# Patient Record
Sex: Female | Born: 1970 | Race: Black or African American | Hispanic: No | Smoking: Never smoker
Health system: Southern US, Community
[De-identification: ages and names within clinical notes are randomized; demographics above are authoritative.]

## PROBLEM LIST (undated history)

## (undated) DIAGNOSIS — D649 Anemia, unspecified: Secondary | ICD-10-CM

## (undated) DIAGNOSIS — I10 Essential (primary) hypertension: Secondary | ICD-10-CM

## (undated) DIAGNOSIS — S83206A Unspecified tear of unspecified meniscus, current injury, right knee, initial encounter: Secondary | ICD-10-CM

## (undated) DIAGNOSIS — S6991XA Unspecified injury of right wrist, hand and finger(s), initial encounter: Secondary | ICD-10-CM

## (undated) DIAGNOSIS — K5900 Constipation, unspecified: Secondary | ICD-10-CM

## (undated) DIAGNOSIS — I471 Supraventricular tachycardia, unspecified: Secondary | ICD-10-CM

## (undated) DIAGNOSIS — Z800 Family history of malignant neoplasm of digestive organs: Secondary | ICD-10-CM

## (undated) DIAGNOSIS — R002 Palpitations: Secondary | ICD-10-CM

## (undated) HISTORY — DX: Unspecified tear of unspecified meniscus, current injury, right knee, initial encounter: S83.206A

## (undated) HISTORY — DX: Family history of malignant neoplasm of digestive organs: Z80.0

## (undated) HISTORY — DX: Supraventricular tachycardia: I47.1

## (undated) HISTORY — DX: Essential (primary) hypertension: I10

## (undated) HISTORY — DX: Constipation, unspecified: K59.00

## (undated) HISTORY — DX: Unspecified injury of right wrist, hand and finger(s), initial encounter: S69.91XA

## (undated) HISTORY — DX: Palpitations: R00.2

## (undated) HISTORY — DX: Supraventricular tachycardia, unspecified: I47.10

---

## 2005-01-25 HISTORY — PX: OTHER SURGICAL HISTORY: SHX169

## 2008-06-20 ENCOUNTER — Emergency Department (HOSPITAL_COMMUNITY): Admission: EM | Admit: 2008-06-20 | Discharge: 2008-06-20 | Payer: Self-pay | Admitting: Emergency Medicine

## 2008-07-24 ENCOUNTER — Encounter (INDEPENDENT_AMBULATORY_CARE_PROVIDER_SITE_OTHER): Payer: Self-pay | Admitting: Obstetrics and Gynecology

## 2008-07-24 ENCOUNTER — Ambulatory Visit (HOSPITAL_COMMUNITY): Admission: RE | Admit: 2008-07-24 | Discharge: 2008-07-24 | Payer: Self-pay | Admitting: Obstetrics and Gynecology

## 2008-07-25 ENCOUNTER — Encounter: Admission: RE | Admit: 2008-07-25 | Discharge: 2008-07-25 | Payer: Self-pay | Admitting: Obstetrics and Gynecology

## 2008-11-26 HISTORY — PX: US ECHOCARDIOGRAPHY: HXRAD669

## 2009-01-25 HISTORY — PX: KNEE ARTHROSCOPY: SUR90

## 2009-11-13 ENCOUNTER — Emergency Department (HOSPITAL_COMMUNITY): Admission: EM | Admit: 2009-11-13 | Discharge: 2009-11-13 | Payer: Self-pay | Admitting: Emergency Medicine

## 2009-11-15 ENCOUNTER — Emergency Department (HOSPITAL_COMMUNITY): Admission: EM | Admit: 2009-11-15 | Discharge: 2009-11-16 | Payer: Self-pay | Admitting: Emergency Medicine

## 2009-12-24 ENCOUNTER — Ambulatory Visit: Payer: Self-pay | Admitting: Cardiovascular Disease

## 2010-03-19 ENCOUNTER — Other Ambulatory Visit: Payer: Self-pay | Admitting: Family Medicine

## 2010-03-19 ENCOUNTER — Ambulatory Visit
Admission: RE | Admit: 2010-03-19 | Discharge: 2010-03-19 | Disposition: A | Discharge status: Home / Self Care | Payer: BC Managed Care – PPO | Source: Ambulatory Visit | Attending: Family Medicine | Admitting: Family Medicine

## 2010-03-19 DIAGNOSIS — J40 Bronchitis, not specified as acute or chronic: Secondary | ICD-10-CM

## 2010-04-07 ENCOUNTER — Other Ambulatory Visit: Payer: Self-pay

## 2010-04-07 DIAGNOSIS — R1013 Epigastric pain: Secondary | ICD-10-CM

## 2010-04-14 ENCOUNTER — Other Ambulatory Visit (HOSPITAL_COMMUNITY): Payer: Self-pay | Admitting: *Deleted

## 2010-04-14 DIAGNOSIS — R1013 Epigastric pain: Secondary | ICD-10-CM

## 2010-04-15 ENCOUNTER — Ambulatory Visit (HOSPITAL_COMMUNITY)
Admission: RE | Admit: 2010-04-15 | Discharge: 2010-04-15 | Disposition: A | Discharge status: Home / Self Care | Payer: BC Managed Care – PPO | Source: Ambulatory Visit | Attending: Radiology | Admitting: Radiology

## 2010-04-15 DIAGNOSIS — Z9884 Bariatric surgery status: Secondary | ICD-10-CM | POA: Insufficient documentation

## 2010-04-15 DIAGNOSIS — Q393 Congenital stenosis and stricture of esophagus: Secondary | ICD-10-CM | POA: Insufficient documentation

## 2010-04-15 DIAGNOSIS — K449 Diaphragmatic hernia without obstruction or gangrene: Secondary | ICD-10-CM | POA: Insufficient documentation

## 2010-04-15 DIAGNOSIS — R079 Chest pain, unspecified: Secondary | ICD-10-CM | POA: Insufficient documentation

## 2010-04-15 DIAGNOSIS — Q391 Atresia of esophagus with tracheo-esophageal fistula: Secondary | ICD-10-CM | POA: Insufficient documentation

## 2010-04-15 DIAGNOSIS — R1013 Epigastric pain: Secondary | ICD-10-CM

## 2010-05-04 LAB — BASIC METABOLIC PANEL
Calcium: 9.4 mg/dL (ref 8.4–10.5)
GFR calc non Af Amer: >60 mL/min (ref >60)
Glucose, Bld: 76 mg/dL (ref 70–99)
Sodium: 137 mEq/L (ref 135–145)

## 2010-05-04 LAB — CBC
Hemoglobin: 14.1 g/dL (ref 12.0–15.0)
Platelets: 335 10*3/uL (ref 150–400)
RDW: 14.1 % (ref 11.5–15.5)

## 2010-05-05 LAB — URINALYSIS, ROUTINE W REFLEX MICROSCOPIC
Nitrite: NEGATIVE
Protein, ur: NEGATIVE mg/dL
Urobilinogen, UA: 1 mg/dL (ref 0.0–1.0)

## 2010-05-05 LAB — URINE MICROSCOPIC-ADD ON

## 2010-06-09 NOTE — Op Note (Signed)
NAME:  Shirley Morris, Shirley Morris        ACCOUNT NO.:  [id number]   MEDICAL RECORD NO.:  [id number]          PATIENT TYPE:  AMB   LOCATION:  SDC                           FACILITY:  [facility]   PHYSICIAN:  Hal Morales, M.D.DATE OF BIRTH:  [birth year]   DATE OF PROCEDURE:  07/24/2008  DATE OF DISCHARGE:                               OPERATIVE REPORT   PREOPERATIVE DIAGNOSES:  Left lower quadrant pain, bilateral ovarian  cysts.   POSTOPERATIVE DIAGNOSES:  Left lower quadrant pain, bilateral ovarian  cysts, plus pelvic adhesions.   PROCEDURE:  Operative laparoscopy, lysis of adhesions, and bilateral  ovarian cystectomies.   SURGEON:  Hal Morales, MD   FIRST ASSISTANT:  Elmira J. Lowell Guitar, PA   ANESTHESIA:  General orotracheal.   ESTIMATED BLOOD LOSS:  Less than 50 mL.   COMPLICATIONS:  None.   FINDINGS:  The left ovary was enlarged with a 7-cm simple cystic  structure.  The cyst and ovary were adherent to the left anterior  abdominal wall.  The right ovary was enlarged with a cyst that measured  approximately 3 cm and was multicystic in nature.  Both cysts contained  clear yellow fluid.  There were no adhesions on the right side.  There  were no peritoneal stigmata of implantations or endometriosis.   PROCEDURE:  The patient was taken to the operating room after  appropriate identification and placed on the operating table.  After the  attainment of adequate general anesthesia, she was placed in the  modified lithotomy position.  The abdomen, perineum, and vagina were  prepped with multiple layers of Betadine, and a Foley catheter inserted  into the bladder and connected to straight drainage.  The Hulka  tenaculum was then used to place on the anterior cervix.  The abdomen  and perineum were draped in a sterile field.  Subumbilical and  suprapubic injection of 0.25% Marcaine for a total of 10 mL was  undertaken.  A subumbilical incision was made, and a Veress cannula  placed  through that incision into the peritoneal cavity.  Pneumoperitoneum was created with 4 L of CO2.  The Veress cannula was  removed, and the laparoscopic trocar placed through that incision into  the peritoneal cavity.  The laparoscope was placed through the trocar  sleeve.  Suprapubic incisions were made to the right and left of  midline, and in the left suprapubic incision, a 5-mm trocar was placed  into the peritoneal cavity under direct visualization.  In the right  suprapubic incision, a 10-mm trocar was placed through the incision into  the peritoneal cavity under direct visualization.  The above-noted  findings were made and documented.  The adhesions between the left ovary  and the left anterior abdominal wall were then lysed sharply.  The  cortex of the ovary was then incised on the antimesenteric side.  This  was enlarged and hydrodissection undertaken.  With a combination of  blunt and sharp dissection, the left ovarian cyst was removed intact  from the left ovary.  The cortex was irrigated and appeared hemostatic.  The ovarian cyst was placed in the posterior cul-de-sac for later  retrieval.  The right ovary was then elevated, and monopolar cautery  used to disrupt the cortex so that the cortex could be incised.  The  incision was enlarged along the length of the ovary, and with a  combination of blunt and sharp dissection as well as hydrodissection,  the ovarian cysts were removed from the right ovarian cortex.  The  smaller of the cyst ruptured in removal, but the larger cyst was removed  intact and placed in the posterior cul-de-sac for retrieval.  An  EndoCatch bag was then placed through the suprapubic 10-mm port and used  to retrieve the 2 ovarian cysts.  They were brought out to the abdominal  wall where the fluid was aspirated from the cyst before they were  removed through the right lower quadrant trocar incision.   It should be mentioned that prior to beginning the  procedure, washings  were obtained from the pelvis.  Once the 2 cysts had been removed from  the pelvis in the EndoCatch bag, the right and left ovarian cortex which  remained was inspected for hemostasis and hemostasis achieved with  cautery.  Copious irrigation was carried out and hemostasis noted to be  adequate.  Approximately 60 mL of warm lactated Ringer was left in the  pelvis.  All instruments were then removed from the peritoneal cavity  under direct visualization as the CO2 was allowed to escape.  The two 10-  mm trocar sites were closed in a similar fashion.  The fascia was closed  with figure-of-eight sutures of 0 Vicryl and the skin incision closed  with subcuticular sutures of 3-0 Vicryl.  The left lower quadrant  incision was closed with a subcuticular suture of 3-0 Vicryl.  Sterile  dressings were applied.  The Hulka tenaculum and Foley catheter were  removed.  The patient was awakened from general anesthesia and taken to  the recovery room in satisfactory condition having tolerated the  procedure well with sponge and instrument counts correct.   SPECIMENS TO PATHOLOGY:  Right and left ovarian cysts and pelvic  washings.   Discharge instructions are printed instructions from the [facility] for laparoscopy.   Discharge medications include the patient's chronic  triamterene/hydrochlorothiazide one-half tablet daily, imipramine 2 tab  at bedtime, multivitamins 1 daily, the newly prescribed Vicodin 1-2 q.4  h. p.r.n. severe pain, and ibuprofen 600 mg with food every 6 hours for  3 days and then as needed for pain.  The patient is advised to use over-  the-counter Colace as needed until bowel movements are normal.   FOLLOWUP:  The patient has a 2-week followup in Dr. Lilian Coma office.      Hal Morales, M.D.  Electronically Signed     VPH/MEDQ  D:  07/24/2008  T:  07/25/2008  Job:  [id number]

## 2010-06-09 NOTE — H&P (Signed)
NAME:  Shirley Morris, Shirley Morris        ACCOUNT NO.:  [id number]   MEDICAL RECORD NO.:  [id number]          PATIENT TYPE:  AMB   LOCATION:  SDC                           [facility]ACILITY:  [facility]   PHYSICIAN:  Hal Morales, M.D.DATE OF BIRTH:  August 06, 1970   DATE OF ADMISSION:  07/24/2008  DATE OF DISCHARGE:                              HISTORY & PHYSICAL   DATE OF EXAMINATION:  July 15, 2008.   HISTORY OF PRESENT ILLNESS:  The patient is a 40 year old black married  female para 1-0-0-1 who presents for management of bilateral ovarian  cyst and left lower quadrant pain.  The patient was initially seen by me  on May 22, 2008, complaining of left lower quadrant pain that had  initially occurred in September 2008.  Ultrasound at that time showed  several small ovarian cysts.  She seemed to do reasonably well until the  last several weeks.  It is of note, however, that in August 2009 she had  an episode of abdominal pain which led to evaluation with a CT scan to  rule out adhesions.  At that time, a left ovarian cyst measuring 5 cm  was noted.  She was to have follow-up of that ovarian cyst but was  asymptomatic so this is the first time that she has had follow-up of  that cyst for recurrence of the abdominal pain.  She underwent an  ultrasound on Jun 06, 2008, which showed a right ovary with 4 ovarian  cysts, the largest of which was 2.4 cm, and a left ovary with a dominant  cyst measuring 6.76 cm.  There was no increased blood flow to either  ovary and no cul-de-sac fluid.  CA-125 at that time was 5.8.  The CT  scan which had been done in August 2009 did not show any evidence of  adenopathy.   The patient specifically denies any nausea, vomiting, constipation or  diarrhea.  She has not had any significant problems with dysuria and her  urine culture and sensitivity was negative.  She has no history of  sexually transmitted infections and her sexually transmitted disease  screen was negative for  gonorrhea, Chlamydia, hepatitis B, hepatitis C,  HIV, RPR and herpes simplex virus-2.  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  She has been asymptomatic from this.   PAST MEDICAL HISTORY:  The patient has a history of chronic hypertension  and migraine.   SURGICAL HISTORY:  1. A cesarean section in 1996 for fetal distress.  2. Arthroscopy of the right knee in 1995.  3. Gastric bypass by laparoscopy in December 2007.   CURRENT MEDICATIONS:  Triamterene, imipramine and Depo-Provera.   GYNECOLOGIC HISTORY:  The patient had an abnormal Pap smear in August  2009 showing low-grade squamous intraepithelial lesion.  Colposcopically  directed biopsies also showed low-grade squamous intraepithelial lesion.  The patient's most recent Pap smear done on May 24, 2008, was negative  for any intraepithelial lesions or malignancy.   FAMILY HISTORY:  Positive for asthma, cancer, hypertension, diabetes,  renal disease, migraine, strokes and joint problems.   EXAMINATION:  The patient is a well-developed  black female in no acute  distress.  The temperature is 97.8, the blood pressure is 122/80, the  weight is 200 pounds.  Height is 5 feet 5 inches.  LUNGS:  Clear.  HEART:  Regular rate and rhythm.  BREASTS:  No dominant masses, discharge, skin changes or nipple  discharge.  ABDOMEN:  Soft, without masses or organomegaly.  There is no tenderness.  EXTREMITIES:  Show no clubbing, cyanosis or edema.  PELVIC:  EG/BUS within normal limits.  The vagina is rugous.  The cervix  is without gross lesions.  The uterus is upper limits of normal size,  nontender and mobile.  No palpable adnexal masses could be appreciated,  though the examination is limited by increased body mass index.  Rectovaginal examination reveals no rectovaginal septal masses.   IMPRESSION:  1. Left lower quadrant pain with left ovarian cyst that has increased      in size over the last 10 months.  There are also  several right      ovarian cysts.  2. Status post laparoscopic bariatric gastric bypass.  3. Chronic hypertension.  4. Status post cesarean section.   DISPOSITION:  A recommendation has been made to remove the cysts from  both ovaries.  An attempt will be made to allow both ovaries to be left  in place, however the discussion has been held that oophorectomy may be  necessary.  The plan is to begin laparoscopically, but the patient  understands that a laparotomy may be required in order to achieve the  ovarian cystectomy or oophorectomy.  The risks of anesthesia, bleeding,  infection and damage to adjacent organs have all been reviewed and the  patient wishes to proceed.  This will be done at [facility] on July 24, 2008.      Hal Morales, M.D.  Electronically Signed     VPH/MEDQ  D:  07/21/2008  T:  07/21/2008  Job:  [id number]

## 2010-06-10 ENCOUNTER — Inpatient Hospital Stay (INDEPENDENT_AMBULATORY_CARE_PROVIDER_SITE_OTHER)
Admission: RE | Admit: 2010-06-10 | Discharge: 2010-06-10 | Disposition: A | Discharge status: Home / Self Care | Payer: BC Managed Care – PPO | Source: Ambulatory Visit | Attending: Family Medicine | Admitting: Family Medicine

## 2010-06-10 DIAGNOSIS — J029 Acute pharyngitis, unspecified: Secondary | ICD-10-CM

## 2010-06-10 LAB — POCT INFECTIOUS MONO SCREEN: Mono Screen: NEGATIVE

## 2010-10-20 ENCOUNTER — Encounter: Payer: Self-pay | Admitting: Cardiovascular Disease

## 2010-11-04 ENCOUNTER — Ambulatory Visit: Payer: BC Managed Care – PPO | Admitting: Cardiovascular Disease

## 2010-11-27 ENCOUNTER — Telehealth: Payer: Self-pay | Admitting: Cardiovascular Disease

## 2010-11-27 ENCOUNTER — Ambulatory Visit
Admission: RE | Admit: 2010-11-27 | Discharge: 2010-11-27 | Disposition: A | Discharge status: Home / Self Care | Payer: BC Managed Care – PPO | Source: Ambulatory Visit | Attending: Nurse Practitioner | Admitting: Nurse Practitioner

## 2010-11-27 ENCOUNTER — Ambulatory Visit (INDEPENDENT_AMBULATORY_CARE_PROVIDER_SITE_OTHER): Payer: BC Managed Care – PPO | Admitting: Nurse Practitioner

## 2010-11-27 ENCOUNTER — Telehealth: Payer: Self-pay | Admitting: Physician Assistant

## 2010-11-27 DIAGNOSIS — R0789 Other chest pain: Secondary | ICD-10-CM | POA: Insufficient documentation

## 2010-11-27 DIAGNOSIS — I498 Other specified cardiac arrhythmias: Secondary | ICD-10-CM

## 2010-11-27 DIAGNOSIS — I10 Essential (primary) hypertension: Secondary | ICD-10-CM | POA: Insufficient documentation

## 2010-11-27 DIAGNOSIS — I471 Supraventricular tachycardia, unspecified: Secondary | ICD-10-CM

## 2010-11-27 DIAGNOSIS — R079 Chest pain, unspecified: Secondary | ICD-10-CM

## 2010-11-27 LAB — CK TOTAL AND CKMB (NOT AT ARMC)
CK, MB: 0.7 ng/mL (ref 0.3–4.0)
Relative Index: 0.6 (ref 0.0–2.5)
Total CK: 117 U/L (ref 7–177)

## 2010-11-27 LAB — D-DIMER, QUANTITATIVE: D-Dimer, Quant: 0.47 ug/mL-FEU (ref 0.00–0.48)

## 2010-11-27 LAB — TROPONIN I: Troponin I: <0.01 ng/mL (ref <0.06)

## 2010-11-27 NOTE — Telephone Encounter (Signed)
Called stating she has been having CP x 45 min; describes on scale of "7". No SOB, no palp. Was scheduled to see Dr. Elease Hashimoto on Mon. Feels like she need to be seen today. Will work in w/[name] at 3:00 today.

## 2010-11-27 NOTE — Telephone Encounter (Signed)
Pt called to follow up lab/Xray results. CKMB not back yet but d-dimer WNL and CXR ok. Pt advised to call 911 and come to ER if Sx worsen. She will wait and keep appt Monday if possible.

## 2010-11-27 NOTE — Assessment & Plan Note (Signed)
Blood pressure is up a little here in the clinic. She will monitor at home. Will recheck on Monday at her next appointment.

## 2010-11-27 NOTE — Assessment & Plan Note (Addendum)
I did give her one sl NTG. No change in her symptoms. Blood pressure dropped to 120/90.   I have discussed her case with Dr. Excell Seltzer. She is felt to be at low risk. Her EKG is normal. Negative stress echo in February of 2011. We will check cardiac enzymes and d dimer. I have sent her for a CXR as well. I advised her to use some Tylenol. She is to keep her appointment with Dr. Elease Hashimoto on Monday. I have asked her to postpone her travel plans for later tonight and to stay home and rest. Her cell # is [phone] if we need to contact. Patient is agreeable to this plan and will call if any problems develop in the interim.   Dr. Elease Hashimoto is updated by phone and in agreement with the above plan of care.

## 2010-11-27 NOTE — Progress Notes (Signed)
Shirley Morris Date of Birth: Apr 10, 1970 Medical Record #[id number]  History of Present Illness: Shirley Morris is seen today for a work in visit. She is seen for Dr. Elease Hashimoto. She had been feeling very good. She has her regular appointment with Dr. Elease Hashimoto on Monday. Her rhythm has been good. She is not really using her Inderal.   She comes in today with a 2 hour history of chest pain. It is described as dull. Located in the left breast and going down to the left upper arm. She felt a little shaky with it. No shortness of breath. No nausea, diaphoresis or clamminess. She got very concerned and called for an appointment. It is worse with deep inspiration. She reports an upper URI last week. Cold symptoms are now resolved. No recent travel, long car rides, or plane trips. No lifting of heavy objects. No real exertional component.   She does not have risk factors for CAD except for her HTN. Both parents alive with no heart disease. She has no smoking history.   Current Outpatient Prescriptions on File Prior to Visit  Medication Sig Dispense Refill  . calcium citrate (CALCITRATE - DOSED IN MG ELEMENTAL CALCIUM) 950 MG tablet Take 1 tablet by mouth daily.        . Cholecalciferol (VITAMIN D PO) Take by mouth.        . Cyanocobalamin (VITAMIN B-12 PO) Take by mouth daily.        Marland Kitchen imipramine (TOFRANIL) 25 MG tablet Take 50 mg by mouth at bedtime.        . Multiple Vitamin (MULTIVITAMIN) tablet Take 1 tablet by mouth daily.        . propranolol (INDERAL) 10 MG tablet Take 10 mg by mouth as needed.        . triamterene-hydrochlorothiazide (MAXZIDE-25) 37.5-25 MG per tablet Take 0.5 tablets by mouth daily.          No Known Allergies  Past Medical History  Diagnosis Date  . SVT (supraventricular tachycardia)   . Hypertension   . Migraine   . Palpitations   . Chest pain   . Tear of meniscus of right knee   . Injury of thumb, right   . Cesarean delivery delivered     for fetal distress     Past Surgical History  Procedure Date  . Knee arthroscopy 1955  . US echocardiography 11/26/2008    EF 55-60%  . Gastic bypass surgery by laparocopy 2007    History  Smoking status  . Never Smoker   Smokeless tobacco  . Not on file    History  Alcohol Use No    Family History  Problem Relation Age of Onset  . Coronary artery disease Father   . Diabetes Father   . Hypertension Father     Review of Systems: The review of systems is per the HPI.  All other systems were reviewed and are negative.  Physical Exam: BP 130/98  Pulse 79  Ht 5\' 5"  (1.651 m)  Wt 236 lb 12.8 oz (107.412 kg)  BMI 39.41 kg/m2 Patient is very pleasant and in no acute distress. She is a little anxious. Skin is warm and dry. Color is normal.  HEENT is unremarkable. Normocephalic/atraumatic. PERRL. Sclera are nonicteric. Neck is supple. No masses. No JVD. Lungs are clear. Cardiac exam shows a regular rate and rhythm. No murmur or rub. No palpable chest wall pain.  Abdomen is obese but soft. Extremities are without edema. Gait  and ROM are intact. No gross neurologic deficits noted.  LABORATORY DATA: EKG shows sinus rhythm and is normal. Tracing is reviewed with Dr. Excell Seltzer (DOD).   Assessment / Plan:

## 2010-11-27 NOTE — Assessment & Plan Note (Signed)
No recurrence reported 

## 2010-11-27 NOTE — Patient Instructions (Signed)
Your EKG is normal.   Dr. Excell Seltzer and I feel like you are low risk for any cardiac problems.  You may have some pleurisy from your recent cold.  We are going to check your labs today, check a CXR and you may use some Tylenol every 8 hours.  Keep your appointment on Monday with Dr. Elease Hashimoto.

## 2010-11-27 NOTE — Telephone Encounter (Signed)
New message:  Patient having chest pain for the last 45 minutes.  She does not feel right.  Has an appt on Monday with Dr. Elease Hashimoto. Call sent to Message nurse.

## 2010-11-28 ENCOUNTER — Telehealth: Payer: Self-pay | Admitting: Nurse Practitioner

## 2010-11-28 NOTE — Telephone Encounter (Signed)
Pt called   again   asking about her lab results.  I reviewed her labs with her and reassured her that everything was WNL.  She has f/u w/ Dr Elease Hashimoto on Monday.

## 2010-11-30 ENCOUNTER — Encounter: Payer: Self-pay | Admitting: Cardiovascular Disease

## 2010-11-30 ENCOUNTER — Ambulatory Visit (INDEPENDENT_AMBULATORY_CARE_PROVIDER_SITE_OTHER): Payer: BC Managed Care – PPO | Admitting: Cardiovascular Disease

## 2010-11-30 DIAGNOSIS — R079 Chest pain, unspecified: Secondary | ICD-10-CM

## 2010-11-30 NOTE — Patient Instructions (Signed)
Your physician wants you to follow-up in:  6 months. You will receive a reminder letter in the mail two months in advance. If you don't receive a letter, please call our office to schedule the follow-up appointment.   

## 2010-11-30 NOTE — Assessment & Plan Note (Signed)
She presents with atypical episodes of chest pain. She had a d-dimer that was normal. Her cardiac enzymes are normal. Chest x-ray is unremarkable. Suspected she had a viral upper respiratory tract infection and had some mild pleuritis. I think that she'll improve with conservative therapy.  I will see her again in 6 months.

## 2010-11-30 NOTE — Progress Notes (Signed)
NATILIE Morris Date of Birth  March 18, 1970 [facility] [address]    [address] [place], [place]  [address] [phone]  Fax  [phone]  History of Present Illness:  Shirley Morris is a 40 year old female with a history of palpitations, hypertension, obesity-status post gastric bypass. She was recently seen for episodes of chest pain.  She's feeling a little bit better since she last saw [name]. Her chest x-ray was unremarkable. Her d-dimer was normal. Her CPK-MB level and her troponin levels were normal.  She now has a runny nose and is also sneezing. She's getting more and more symptoms of an upper respiratory tract infection.  Current Outpatient Prescriptions on File Prior to Visit  Medication Sig Dispense Refill  . calcium citrate (CALCITRATE - DOSED IN MG ELEMENTAL CALCIUM) 950 MG tablet Take 1 tablet by mouth daily.        . Cholecalciferol (VITAMIN D PO) Take by mouth.        . Cyanocobalamin (VITAMIN B-12 PO) Take by mouth daily.        Marland Kitchen imipramine (TOFRANIL) 25 MG tablet Take 50 mg by mouth at bedtime.        . MedroxyPROGESTERone Acetate (DEPO-PROVERA IM) Inject into the muscle. Taking every 3 months       . Multiple Vitamin (MULTIVITAMIN) tablet Take 1 tablet by mouth daily.        . propranolol (INDERAL) 10 MG tablet Take 10 mg by mouth as needed.        . triamterene-hydrochlorothiazide (MAXZIDE-25) 37.5-25 MG per tablet Take 0.5 tablets by mouth daily.          No Known Allergies  Past Medical History  Diagnosis Date  . SVT (supraventricular tachycardia)   . Hypertension   . Migraine   . Palpitations   . Chest pain     Negative stress echo in February of 2011  . Tear of meniscus of right knee   . Injury of thumb, right   . Cesarean delivery delivered     for fetal distress    Past Surgical History  Procedure Date  . Knee arthroscopy 1955  . US echocardiography 11/26/2008    EF 55-60%  . Gastic bypass surgery by laparocopy 2007    History    Smoking status  . Never Smoker   Smokeless tobacco  . Not on file    History  Alcohol Use No    Family History  Problem Relation Age of Onset  . Coronary artery disease Father   . Diabetes Father   . Hypertension Father     Reviw of Systems:  Reviewed in the HPI.  All other systems are negative.  Physical Exam: BP 112/86  Pulse 84  Ht 5\' 5"  (1.651 m)  Wt 232 lb (105.235 kg)  BMI 38.61 kg/m2 The patient is alert and oriented x 3.  The mood and affect are normal.   Skin: warm and dry.  Color is normal.    HEENT:   Normocephalic, atraumatic. Her carotids are normal. There is no JVD. Neck is supple. Mucous membranes are moist.  Lungs: Her lungs are clear. She has no significant chest wall tenderness.   Heart: Heart regular rate S1-S2.    Abdomen: Abdominal exam is mildly obese. There is no hepatosplenomegaly.  Extremities:  No clubbing cyanosis or edema. The or cords.  Neuro:  Nonfocal. Her gait is normal.     Assessment / Plan:

## 2010-12-02 ENCOUNTER — Telehealth: Payer: Self-pay | Admitting: *Deleted

## 2010-12-02 NOTE — Telephone Encounter (Signed)
Left normal results msg for labs cxr,

## 2010-12-02 NOTE — Telephone Encounter (Signed)
Message copied by Antony Odea on Wed Dec 02, 2010  3:27 PM ------      Message from: Rosalio Macadamia      Created: Mon Nov 30, 2010  7:51 AM       Ok to report. Labs are satisfactory. Should be coming back to see Dr. Elease Hashimoto on Monday.

## 2011-03-25 ENCOUNTER — Other Ambulatory Visit (INDEPENDENT_AMBULATORY_CARE_PROVIDER_SITE_OTHER): Payer: BC Managed Care – PPO

## 2011-03-25 DIAGNOSIS — Z3040 Encounter for surveillance of contraceptives, unspecified: Secondary | ICD-10-CM

## 2011-05-21 ENCOUNTER — Other Ambulatory Visit: Payer: Self-pay | Admitting: Otolaryngology

## 2011-05-21 ENCOUNTER — Ambulatory Visit
Admission: RE | Admit: 2011-05-21 | Discharge: 2011-05-21 | Disposition: A | Discharge status: Home / Self Care | Payer: BC Managed Care – PPO | Source: Ambulatory Visit | Attending: Otolaryngology | Admitting: Otolaryngology

## 2011-05-21 DIAGNOSIS — J039 Acute tonsillitis, unspecified: Secondary | ICD-10-CM

## 2011-05-21 MED ORDER — IOHEXOL 300 MG/ML  SOLN
75.0000 mL | Freq: Once | INTRAMUSCULAR | Status: AC | PRN
  Administered 2011-05-21: 75 mL via INTRAVENOUS

## 2011-06-15 ENCOUNTER — Ambulatory Visit: Payer: BC Managed Care – PPO | Admitting: Cardiovascular Disease

## 2011-07-09 ENCOUNTER — Other Ambulatory Visit: Payer: BC Managed Care – PPO

## 2011-07-16 ENCOUNTER — Other Ambulatory Visit (INDEPENDENT_AMBULATORY_CARE_PROVIDER_SITE_OTHER): Payer: BC Managed Care – PPO

## 2011-07-16 DIAGNOSIS — Z3009 Encounter for other general counseling and advice on contraception: Secondary | ICD-10-CM

## 2011-07-16 MED ORDER — MEDROXYPROGESTERONE ACETATE 150 MG/ML IM SUSP
150.0000 mg | Freq: Once | INTRAMUSCULAR | Status: AC
  Administered 2011-07-16: 150 mg via INTRAMUSCULAR

## 2011-07-16 NOTE — Progress Notes (Signed)
Next depo injection due 10/07/11.

## 2011-08-24 ENCOUNTER — Ambulatory Visit (INDEPENDENT_AMBULATORY_CARE_PROVIDER_SITE_OTHER): Payer: BC Managed Care – PPO | Admitting: Obstetrics and Gynecology

## 2011-08-24 ENCOUNTER — Encounter: Payer: Self-pay | Admitting: Obstetrics and Gynecology

## 2011-08-24 VITALS — BP 108/68 | Temp 98.9°F | Ht 65.5 in | Wt 240.0 lb

## 2011-08-24 DIAGNOSIS — N949 Unspecified condition associated with female genital organs and menstrual cycle: Secondary | ICD-10-CM

## 2011-08-24 DIAGNOSIS — L439 Lichen planus, unspecified: Secondary | ICD-10-CM

## 2011-08-24 DIAGNOSIS — E569 Vitamin deficiency, unspecified: Secondary | ICD-10-CM

## 2011-08-24 DIAGNOSIS — Z124 Encounter for screening for malignant neoplasm of cervix: Secondary | ICD-10-CM

## 2011-08-24 DIAGNOSIS — R102 Pelvic and perineal pain: Secondary | ICD-10-CM

## 2011-08-24 DIAGNOSIS — L661 Lichen planopilaris: Secondary | ICD-10-CM

## 2011-08-24 LAB — POCT URINALYSIS DIPSTICK
Ketones, UA: NEGATIVE
Leukocytes, UA: NEGATIVE
Protein, UA: NEGATIVE
pH, UA: 5

## 2011-08-24 NOTE — Progress Notes (Signed)
  AEX VISIT  Last Pap: 05/30/2010 WNL: Yes Regular Periods:no Contraception: Depo Provera  Monthly Breast exam:yes Tetanus<62yrs:yes Nl.Bladder Function:yes Daily BMs:yes Healthy Diet:yes Calcium:yes Mammogram:yes Date of Mammogram: couple years ago Exercise:yes Have often Exercise: occasional  Seatbelt: yes Abuse at home: no Stressful work:no Sigmoid-colonoscopy: n/a  Bone Density: Yes 08/17/2010 PCP: Johny Blamer Change in PMH:  None Change in [facility]:  None  Subjective:    Shirley Morris is a 41 y.o. female, G1P1, who presents for an annual exam. 1 month hx of pelvic heaviness especially after IC and lasts for several days.  Similar to sensation that was present with ovarian cyst. No GI or urinary sx. Also c/o starch and clay PICA for months   History   Social History  . Marital Status: Married    Spouse Name: N/A    Number of Children: N/A  . Years of Education: N/A   Social History Main Topics  . Smoking status: Never Smoker   . Smokeless tobacco: Never Used  . Alcohol Use: No  . Drug Use: No  . Sexually Active: Yes    Birth Control/ Protection: Injection     depo provera   Other Topics Concern  . None   Social History Narrative  . None    Menstrual cycle:   LMP: No menses because of Depo Provera  The following portions of the patient's history were reviewed and updated as appropriate: allergies, current medications, past family history, past medical history, past social history, past surgical history and problem list.  Review of Systems Pertinent items are noted in HPI. Breast:Negative for breast lump,nipple discharge or nipple retraction Gastrointestinal: Negative for abdominal pain, change in bowel habits or rectal bleeding Urinary:negative   Objective:    BP 108/68  Temp 98.9 F (37.2 C) (Oral)  Ht 5' 5.5" (1.664 m)  Wt 240 lb (108.863 kg)  BMI 39.33 kg/m2    Weight:  Wt Readings from Last 1 Encounters:  08/24/11 240 lb (108.863  kg)          BMI: Body mass index is 39.33 kg/(m^2).  General Appearance: Alert, appropriate appearance for age. No acute distress HEENT: Grossly normal Neck / Thyroid: Supple, no masses, nodes or enlargement Lungs: clear to auscultation bilaterally Back: No CVA tenderness Breast Exam: No masses or nodes.No dimpling, nipple retraction or discharge. Cardiovascular: Regular rate and rhythm. S1, S2, no murmur Gastrointestinal: Soft, non-tender, no masses or organomegaly Pelvic Exam: External genitalia: normal general appearance Vaginal: normal mucosa without prolapse or lesions Cervix: normal appearance Adnexa: no masses but exam is compromised by patient habitus Uterus: doesnt feel enlarged but exam is compromised by patient habitus Rectovaginal: normal rectal, no masses Lymphatic Exam: Non-palpable nodes in neck, clavicular, axillary, or inguinal regions Skin: no rash or abnormalities Neurologic: Normal gait and speech, no tremor  Psychiatric: Alert and oriented, appropriate affect.   Wet Prep:not applicable Urinalysis:neg UPT: Not done   Assessment:  PICA for last six months  Pelvic pain  Hx ovarian cyst   Plan:   Paps annually until 2027 for history of abnl paps mammogram Cmp, hgb STD screening: done, discussed Contraception:Depo-Provera Ultrasound at [facility] Other labs per orders      Jemel Ono PMD  Pt c/o pelvic pain, "feeling heavy", Pain during intercourse.

## 2011-08-25 LAB — COMPREHENSIVE METABOLIC PANEL
ALT: 15 U/L (ref 0–35)
AST: 17 U/L (ref 0–37)
Albumin: 3.8 g/dL (ref 3.5–5.2)
Alkaline Phosphatase: 89 U/L (ref 39–117)
Glucose, Bld: 76 mg/dL (ref 70–99)
Potassium: 3.8 mEq/L (ref 3.5–5.3)
Sodium: 140 mEq/L (ref 135–145)
Total Protein: 7.1 g/dL (ref 6.0–8.3)

## 2011-08-25 LAB — RPR

## 2011-08-25 LAB — VITAMIN D 25 HYDROXY (VIT D DEFICIENCY, FRACTURES): Vit D, 25-Hydroxy: 36 ng/mL (ref 30–89)

## 2011-08-25 LAB — HSV 2 ANTIBODY, IGG: HSV 2 Glycoprotein G Ab, IgG: 0.28 IV

## 2011-08-26 LAB — PAP IG, CT-NG, RFX HPV ASCU: GC Probe Amp: NEGATIVE

## 2011-08-31 ENCOUNTER — Other Ambulatory Visit: Payer: Self-pay | Admitting: Obstetrics and Gynecology

## 2011-08-31 ENCOUNTER — Ambulatory Visit (INDEPENDENT_AMBULATORY_CARE_PROVIDER_SITE_OTHER): Payer: BC Managed Care – PPO | Admitting: Obstetrics and Gynecology

## 2011-08-31 ENCOUNTER — Encounter: Payer: Self-pay | Admitting: Internal Medicine

## 2011-08-31 ENCOUNTER — Ambulatory Visit (INDEPENDENT_AMBULATORY_CARE_PROVIDER_SITE_OTHER): Payer: BC Managed Care – PPO

## 2011-08-31 ENCOUNTER — Encounter: Payer: Self-pay | Admitting: Obstetrics and Gynecology

## 2011-08-31 ENCOUNTER — Telehealth: Payer: Self-pay

## 2011-08-31 VITALS — BP 126/78 | Temp 99.3°F | Ht 65.5 in | Wt 241.0 lb

## 2011-08-31 DIAGNOSIS — Z1231 Encounter for screening mammogram for malignant neoplasm of breast: Secondary | ICD-10-CM

## 2011-08-31 DIAGNOSIS — R102 Pelvic and perineal pain: Secondary | ICD-10-CM | POA: Insufficient documentation

## 2011-08-31 DIAGNOSIS — R935 Abnormal findings on diagnostic imaging of other abdominal regions, including retroperitoneum: Secondary | ICD-10-CM

## 2011-08-31 DIAGNOSIS — N949 Unspecified condition associated with female genital organs and menstrual cycle: Secondary | ICD-10-CM

## 2011-08-31 DIAGNOSIS — Z800 Family history of malignant neoplasm of digestive organs: Secondary | ICD-10-CM

## 2011-08-31 DIAGNOSIS — N898 Other specified noninflammatory disorders of vagina: Secondary | ICD-10-CM

## 2011-08-31 LAB — POCT WET PREP (WET MOUNT): Clue Cells Wet Prep Whiff POC: NEGATIVE

## 2011-08-31 MED ORDER — FLUCONAZOLE 150 MG PO TABS: 150.0000 mg | ORAL_TABLET | Freq: Once | ORAL | Status: AC

## 2011-08-31 NOTE — Patient Instructions (Signed)
Ibuprofen 600mg  by mouth every 6 hours for the next 48hrs.  Next dose due at 4:45 pm

## 2011-08-31 NOTE — Telephone Encounter (Signed)
Tc to pt per [facility] referral sched 09/29/11@1 :45 with Dr. Marina Goodell. Lm on vm to cb to make aware.

## 2011-08-31 NOTE — Progress Notes (Signed)
GYN PROBLEM VISIT  [name] is a 41 y.o. year old female,G1P1, who presents for a problem visit. Pt c/o internal vaginal itching. No odor or discharge noticed.   Subjective: She has had some crampy abdominal pain in the suprapubic region .  She has had no menses presumably because of Depo Provera.    Objective:  BP 126/78  Temp 99.3 F (37.4 C)  Ht 5' 5.5" (1.664 m)  Wt 241 lb (109.317 kg)  BMI 39.49 kg/m2   ULTRASOUND: Uterus: Length: 7 cm   Width:  4 cm   Height:  3 cm Endo thickness:  3 mm   Left ovary:Normal Right ovary:Abnormal - Simple cyst 2.8x2.3x2.5cm Fibroids:no   CDS fluid:no Comment: Anteverted uterus, Complex fluid filled endometrium(Internal echoes likely representing blood), RTOV simple cyst=2.8x2.3x2.5cm, LTOV is normal, No fluid in CDS, Normal adnexas.   EXAM: GI: soft, non-tender; bowel sounds normal; no masses,  no organomegaly Pelvic: External genitalia: normal general appearance Vaginal: normal mucosa without prolapse or lesions Cervix: stenotic os withsmall dot of blood at the os .  Normal appearance.  Adnexa: non palpable Uterus: normal single, nontender Rectal: deferred Exam limited by body habitus  Wet Prep: yeast  Assessment:  Vaginal yeast infection Cervical stenosis Probable hematometra with crampy discomfort Small right ovarian cyst Family hx of colon cancer Pelvic pain  Plan: Diflucan  GI referral Cervical dilation, with consent, allowed for egress of dark mucoid blood Endometrial evacuation, with consent, with a pipelle yielded 3-5cc of dark mucoid blood which was sent for pathologic evaluation Pt will be notified of results by phone Return to office prn if symptoms worsen or fail to improve.or in 6 wks   Shirley Morris,   08/31/2011 10:14 AM

## 2011-08-31 NOTE — Telephone Encounter (Signed)
Tc from pt. Pt informed of GI referral. Pt agrees.

## 2011-09-06 ENCOUNTER — Telehealth: Payer: Self-pay

## 2011-09-06 NOTE — Telephone Encounter (Signed)
Lm on vm to cb per test results.  

## 2011-09-06 NOTE — Telephone Encounter (Signed)
Tc from pt per telephone call. Told pt [id number]. Pt to keep follow up appt as planned. Pt voices understanding.

## 2011-09-08 ENCOUNTER — Telehealth: Payer: Self-pay

## 2011-09-08 NOTE — Telephone Encounter (Signed)
Tc to pt per telephone call. Pt c/o lower abd pain x 2days. Pt started bldg today. Pt wearing a pad without changing it yet today. Pt taking Advil with improvement. Pt states",GI appt sched 09/29/11". Will consult with vph per any additonal recs at this time. Pt agrees.

## 2011-09-09 ENCOUNTER — Encounter: Payer: Self-pay | Admitting: Obstetrics and Gynecology

## 2011-09-09 ENCOUNTER — Ambulatory Visit (INDEPENDENT_AMBULATORY_CARE_PROVIDER_SITE_OTHER): Payer: BC Managed Care – PPO | Admitting: Obstetrics and Gynecology

## 2011-09-09 VITALS — BP 122/72 | Temp 99.5°F | Ht 65.5 in | Wt 246.0 lb

## 2011-09-09 DIAGNOSIS — N710 Acute inflammatory disease of uterus: Secondary | ICD-10-CM

## 2011-09-09 DIAGNOSIS — B373 Candidiasis of vulva and vagina: Secondary | ICD-10-CM

## 2011-09-09 DIAGNOSIS — N939 Abnormal uterine and vaginal bleeding, unspecified: Secondary | ICD-10-CM | POA: Insufficient documentation

## 2011-09-09 DIAGNOSIS — N949 Unspecified condition associated with female genital organs and menstrual cycle: Secondary | ICD-10-CM

## 2011-09-09 DIAGNOSIS — N926 Irregular menstruation, unspecified: Secondary | ICD-10-CM

## 2011-09-09 DIAGNOSIS — R102 Pelvic and perineal pain: Secondary | ICD-10-CM

## 2011-09-09 MED ORDER — CEFTRIAXONE SODIUM 1 G IJ SOLR
500.0000 mg | Freq: Once | INTRAMUSCULAR | Status: AC
  Administered 2011-09-09: 500 mg via INTRAMUSCULAR

## 2011-09-09 MED ORDER — FLUCONAZOLE 150 MG PO TABS: 150.0000 mg | ORAL_TABLET | ORAL | Status: DC | PRN

## 2011-09-09 MED ORDER — ESTRADIOL 1 MG PO TABS: 2.0000 mg | ORAL_TABLET | Freq: Every day | ORAL | Status: DC

## 2011-09-09 NOTE — Progress Notes (Signed)
GYN PROBLEM VISIT  [name] is a 41 y.o. year old female,G1P1, who presents for a problem visit.   Subjective:  When did bleeding start: 09/08/2011  How  Long: bleeding x 1 day How often changing pad/tampon: ever 2 hours. Using both.  Bleeding Disorders: no Cramping: yes Contraception: yes Fibroids: no Hormone Therapy: no New Medications: no Menopausal Symptoms: no Vag. Discharge: no Abdominal Pain: yes started on Friday. Lower abdominal pain.  Increased Stress: no  Pt had dialation of the cervix with release of hematometra at her last visit.  Bleeding tapered to stop after 4-5 days and restarted on yesterday.   It was heavy enough to soil clothing and sheets.  She also complains of crampy lower abdominal and pelvic pain.  She denies a fever or chills at home.  No nausea or vomiting.   Objective:  BP 122/72  Temp 99.5 F (37.5 C) (Oral)  Ht 5' 5.5" (1.664 m)  Wt 246 lb (111.585 kg)  BMI 40.31 kg/m2   GI: soft, non-tender; bowel sounds normal; no masses,  no organomegaly and abnormal findings:  tender to deep palpation in the midline suprapubically  External genitalia: normal general appearance Vaginal: normal mucosa without prolapse or lesions and blood in vault Cervix: blood from the os.  mild tenderness to lateral motion Adnexa: no masses, mild tenderness bilaterally Uterus: upper limits of nl size with mild tenderness to motion  Assessment: Hematometra s/p drainage Abnormal uterine bleeding Mild endometritis  Plan: Antibiotics.  Pt requested injection rather than oral.  Rocephin 500mg  IM given without difficulty Estradiol 2 mg daily until bleeding stops then 1mg  daily for an additional week Return to office 2 weeks  Dierdre Forth, MD  09/09/2011 4:25 PM

## 2011-09-09 NOTE — Patient Instructions (Signed)
Take Estradiol 2mg  until bleeding stops.  Then take Estradiol 1 mg for another week then stop

## 2011-09-10 ENCOUNTER — Ambulatory Visit
Admission: RE | Admit: 2011-09-10 | Discharge: 2011-09-10 | Disposition: A | Discharge status: Home / Self Care | Payer: BC Managed Care – PPO | Source: Ambulatory Visit | Attending: Obstetrics and Gynecology | Admitting: Obstetrics and Gynecology

## 2011-09-10 DIAGNOSIS — Z1231 Encounter for screening mammogram for malignant neoplasm of breast: Secondary | ICD-10-CM

## 2011-09-13 ENCOUNTER — Other Ambulatory Visit: Payer: Self-pay | Admitting: Obstetrics and Gynecology

## 2011-09-13 DIAGNOSIS — R928 Other abnormal and inconclusive findings on diagnostic imaging of breast: Secondary | ICD-10-CM

## 2011-09-17 ENCOUNTER — Encounter: Payer: Self-pay | Admitting: Internal Medicine

## 2011-09-21 ENCOUNTER — Encounter: Payer: Self-pay | Admitting: Gastroenterology

## 2011-09-23 ENCOUNTER — Ambulatory Visit
Admission: RE | Admit: 2011-09-23 | Discharge: 2011-09-23 | Disposition: A | Discharge status: Home / Self Care | Payer: BC Managed Care – PPO | Source: Ambulatory Visit | Attending: Obstetrics and Gynecology | Admitting: Obstetrics and Gynecology

## 2011-09-23 ENCOUNTER — Ambulatory Visit (INDEPENDENT_AMBULATORY_CARE_PROVIDER_SITE_OTHER): Payer: BC Managed Care – PPO | Admitting: Obstetrics and Gynecology

## 2011-09-23 ENCOUNTER — Encounter: Payer: Self-pay | Admitting: Obstetrics and Gynecology

## 2011-09-23 VITALS — BP 122/74 | Ht 65.0 in | Wt 247.0 lb

## 2011-09-23 DIAGNOSIS — N949 Unspecified condition associated with female genital organs and menstrual cycle: Secondary | ICD-10-CM

## 2011-09-23 DIAGNOSIS — R928 Other abnormal and inconclusive findings on diagnostic imaging of breast: Secondary | ICD-10-CM

## 2011-09-23 DIAGNOSIS — R102 Pelvic and perineal pain: Secondary | ICD-10-CM

## 2011-09-23 DIAGNOSIS — N6489 Other specified disorders of breast: Secondary | ICD-10-CM | POA: Insufficient documentation

## 2011-09-23 DIAGNOSIS — N857 Hematometra: Secondary | ICD-10-CM

## 2011-09-23 NOTE — Progress Notes (Signed)
GYN PROBLEM VISIT  [name] is a 41 y.o. year old female,G1P1, who presents for Followup  Subjective:  Pt here to f/u for abnormal uterine bleeding. She had a hematometra drained at her last visit.  She was treated with IM Rocephin.  She has had some improvement in her pelvic pain.  She has taken estradiol 2 mg daily with some decrease in the amount of bleeding that she has had.  Today it has really decreased to almost nothing. Patient was seen a for followup mammogram and ultrasound after having found an irregularity on breast examination.  Objective:  BP 122/74  Ht 5\' 5"  (1.651 m)  Wt 247 lb (112.038 kg)  BMI 41.10 kg/m2     External genitalia: normal general appearance Vaginal: normal rugae and no abnormal discharge and no blood noted Cervix: normal appearance Adnexa: non palpable and No tenderness Uterus: does not feel enlarged but exam is compromised by patient habitus   Assessment: Pelvic pain probably secondary to hematometra, now status post drainage with improvement in pain. Abnormal uterine bleeding secondary to atrophy from a combination of Depo-Provera and hematometra Left breast irregularity  Plan: Continue  estradiol 2 mg daily until September 12 when patient will her next Depo-Provera injection She followup appointment in 4 weeks The patient has a GI appointment on 10/15/2011 Return to office in 4 week(s).   Dierdre Forth, MD  09/23/2011 5:03 PM

## 2011-09-29 ENCOUNTER — Ambulatory Visit: Payer: BC Managed Care – PPO | Admitting: Internal Medicine

## 2011-10-05 ENCOUNTER — Other Ambulatory Visit: Payer: Self-pay | Admitting: Obstetrics and Gynecology

## 2011-10-07 ENCOUNTER — Other Ambulatory Visit (INDEPENDENT_AMBULATORY_CARE_PROVIDER_SITE_OTHER): Payer: BC Managed Care – PPO

## 2011-10-07 ENCOUNTER — Telehealth: Payer: Self-pay

## 2011-10-07 DIAGNOSIS — B373 Candidiasis of vulva and vagina: Secondary | ICD-10-CM

## 2011-10-07 DIAGNOSIS — Z3009 Encounter for other general counseling and advice on contraception: Secondary | ICD-10-CM

## 2011-10-07 MED ORDER — FLUCONAZOLE 150 MG PO TABS: 150.0000 mg | ORAL_TABLET | Freq: Once | ORAL | Status: DC

## 2011-10-07 MED ORDER — MEDROXYPROGESTERONE ACETATE 150 MG/ML IM SUSP
150.0000 mg | Freq: Once | INTRAMUSCULAR | Status: AC
  Administered 2011-10-07: 150 mg via INTRAMUSCULAR

## 2011-10-07 NOTE — Progress Notes (Unsigned)
Next Depo due 12-29-2011 

## 2011-10-07 NOTE — Telephone Encounter (Signed)
Pt here for Depo Provera inj today. Pt still c/o mild vaginal itching and would like a rf for Diflucan. Consulted with EP, ok to give rf with 1 additonal rf. Pt agrees. Rx e-pres to pharm on file. Pt agrees.

## 2011-10-15 ENCOUNTER — Ambulatory Visit (INDEPENDENT_AMBULATORY_CARE_PROVIDER_SITE_OTHER): Payer: BC Managed Care – PPO | Admitting: Gastroenterology

## 2011-10-15 ENCOUNTER — Encounter: Payer: Self-pay | Admitting: Gastroenterology

## 2011-10-15 ENCOUNTER — Other Ambulatory Visit: Payer: Self-pay | Admitting: *Deleted

## 2011-10-15 ENCOUNTER — Other Ambulatory Visit (INDEPENDENT_AMBULATORY_CARE_PROVIDER_SITE_OTHER): Payer: BC Managed Care – PPO

## 2011-10-15 VITALS — BP 132/80 | HR 68 | Ht 65.0 in | Wt 243.0 lb

## 2011-10-15 DIAGNOSIS — Z9884 Bariatric surgery status: Secondary | ICD-10-CM

## 2011-10-15 DIAGNOSIS — E66813 Obesity, class 3: Secondary | ICD-10-CM

## 2011-10-15 DIAGNOSIS — D649 Anemia, unspecified: Secondary | ICD-10-CM

## 2011-10-15 DIAGNOSIS — K5901 Slow transit constipation: Secondary | ICD-10-CM

## 2011-10-15 LAB — IBC PANEL: Saturation Ratios: 4.9 % — ABNORMAL LOW (ref 20.0–50.0)

## 2011-10-15 LAB — FOLATE: Folate: >24.8 ng/mL (ref >5.9)

## 2011-10-15 MED ORDER — PEG-KCL-NACL-NASULF-NA ASC-C 100 G PO SOLR: 1.0000 | Freq: Once | ORAL | Status: DC

## 2011-10-15 MED ORDER — HYOSCYAMINE-PHENYLTOLOXAMINE 0.0625-15 MG PO CAPS: 0.0625 mg | ORAL_CAPSULE | Freq: Two times a day (BID) | ORAL | Status: DC

## 2011-10-15 MED ORDER — PANTOPRAZOLE SODIUM 40 MG PO TBEC: 40.0000 mg | DELAYED_RELEASE_TABLET | Freq: Every day | ORAL | Status: DC

## 2011-10-15 MED ORDER — IRON POLYSACCH CMPLX-B12-FA 150-0.025-1 MG PO CAPS: 1.0000 | ORAL_CAPSULE | Freq: Every day | ORAL | Status: DC

## 2011-10-15 NOTE — Progress Notes (Signed)
History of Present Illness:  This is a 41 year old African American female status post gastric bypass at [facility] 12 years ago for obesity. 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 She does have dumping syndrome if she is the wrong foods. The patient has had starch ingestion for many years associated with chronic anemia. The last 2 months she's had lower nominal discomfort felt secondary to hematoma in her endometrium which has been treated by gynecology. Today, the patient really denies other gastrointestinal complaints except for periodic left lower quadrant pain without rectal bleeding, diarrhea, or nausea and vomiting. Lab data showed a mild chronic iron deficiency, but otherwise labs are reviewed in unremarkable. Chart review shows a normal upper GI series in March of 2012. Actually, this patient does have mild chronic constipation related to play in starch ingestion. Family history is remarkable for colon cancer in a first cousin and an uncle. She has not had previous colonoscopy or barium studies of her lower gut. There is no history of previous hepatitis, pancreatitis, or gallbladder disease.  I have reviewed this patient's present history, medical and surgical past history, allergies and medications.     ROS: The remainder of the 10 point ROS is negative     Physical Exam: Blood pressure 132/80, pulse 60 and regular, and weight 243 pounds with BMI of 40.44. General well developed well nourished patient in no acute distress, appearing her stated age Eyes PERRLA, no icterus, fundoscopic exam per opthamologist Skin no lesions noted Neck supple, no adenopathy, no thyroid enlargement, no tenderness Chest clear to percussion and auscultation Heart no significant murmurs, gallops or rubs noted Abdomen no hepatosplenomegaly masses or tenderness, BS normal.  Rectal inspection normal no fissures, or fistulae noted.  No masses or tenderness on digital  exam. Stool guaiac negative. Extremities no acute joint lesions, edema, phlebitis or evidence of cellulitis. Neurologic patient oriented x 3, cranial nerves intact, no focal neurologic deficits noted. Psychological mental status normal and normal affect.  Assessment and plan: 41 year old patient status post gastric bypass with associated dumping syndrome. Her constipation is associated with [place] and starch ingestion which is usually related iron deficiency, but is also a cultural habit. I have ordered anemia profile, have placed her on a high fiber diet with daily Metamucil and liberal by mouth fluids, and have asked her to discontinue starch and Clay use. I've scheduled her for followup endoscopy and colonoscopy at her convenience while continuing other medications as per primary care in gynecology. She is to continue her multivitamins, calcium, vitamin D, B12, she has normal liver function tests and no symptoms to suggest problems with cholelithiasis or fatty liver.  Encounter Diagnosis  Name Primary?  Marland Kitchen Anemia Yes

## 2011-10-15 NOTE — Patient Instructions (Addendum)
You have been scheduled for an endoscopy and colonoscopy with propofol. Please follow the written instructions given to you at your visit today. Please pick up your prep at the pharmacy within the next 1-3 days. If you use inhalers (even only as needed), please bring them with you on the day of your procedure. Your physician has requested that you go to the basement for the following lab work before leaving today. We have sent the following medications to your pharmacy for you to pick up at your convenience: Digex and Protonix. Also purchase over the couinter Metamucil and mix one tablespoon with juice every day. Also per Dr. Jarold Motto, Stop eating clay.  CC: Shirley Morris, M. D.High Fiber Diet A high fiber diet changes your normal diet to include more whole grains, legumes, fruits, and vegetables. Changes in the diet involve replacing refined carbohydrates with unrefined foods. The calorie level of the diet is essentially unchanged. The Dietary Reference Intake (recommended amount) for adult males is 38 g per day. For adult females, it is 25 g per day. Pregnant and lactating women should consume 28 g of fiber per day. Fiber is the intact part of a plant that is not broken down during digestion. Functional fiber is fiber that has been isolated from the plant to provide a beneficial effect in the body. PURPOSE  Increase stool bulk.   Ease and regulate bowel movements.   Lower cholesterol.  INDICATIONS THAT YOU NEED MORE FIBER  Constipation and hemorrhoids.   Uncomplicated diverticulosis (intestine condition) and irritable bowel syndrome.   Weight management.   As a protective measure against hardening of the arteries (atherosclerosis), diabetes, and cancer.  NOTE OF CAUTION If you have a digestive or bowel problem, ask your caregiver for advice before adding high fiber foods to your diet. Some of the following medical problems are such that a high fiber diet should not be used without  consulting your caregiver:  Acute diverticulitis (intestine infection).   Partial small bowel obstructions.   Complicated diverticular disease involving bleeding, rupture (perforation), or abscess (boil, furuncle).   Presence of autonomic neuropathy (nerve damage) or gastric paresis (stomach cannot empty itself).  GUIDELINES FOR INCREASING FIBER  Start adding fiber to the diet slowly. A gradual increase of about 5 more grams (2 slices of whole-wheat bread, 2 servings of most fruits or vegetables, or 1 bowl of high fiber cereal) per day is best. Too rapid an increase in fiber may result in constipation, flatulence, and bloating.   Drink enough water and fluids to keep your urine clear or pale yellow. Water, juice, or caffeine-free drinks are recommended. Not drinking enough fluid may cause constipation.   Eat a variety of high fiber foods rather than one type of fiber.   Try to increase your intake of fiber through using high fiber foods rather than fiber pills or supplements that contain small amounts of fiber.   The goal is to change the types of food eaten. Do not supplement your present diet with high fiber foods, but replace foods in your present diet.  INCLUDE A VARIETY OF FIBER SOURCES  Replace refined and processed grains with whole grains, canned fruits with fresh fruits, and incorporate other fiber sources. White rice, white breads, and most bakery goods contain little or no fiber.   Brown whole-grain rice, buckwheat oats, and many fruits and vegetables are all good sources of fiber. These include: broccoli, Brussels sprouts, cabbage, cauliflower, beets, sweet potatoes, white potatoes (skin on), carrots, tomatoes, eggplant,  squash, berries, fresh fruits, and dried fruits.   Cereals appear to be the richest source of fiber. Cereal fiber is found in whole grains and bran. Bran is the fiber-rich outer coat of cereal grain, which is largely removed in refining. In whole-grain cereals,  the bran remains. In breakfast cereals, the largest amount of fiber is found in those with "bran" in their names. The fiber content is sometimes indicated on the label.   You may need to include additional fruits and vegetables each day.   In baking, for 1 cup white flour, you may use the following substitutions:   1 cup whole-wheat flour minus 2 tbs.    cup white flour plus  cup whole-wheat flour.  Document Released: 01/11/2005 Document Revised: 12/31/2010 Document Reviewed: 11/19/2008 [facility] Patient Information 2012 [place], [place].

## 2011-10-18 ENCOUNTER — Encounter: Payer: Self-pay | Admitting: Gastroenterology

## 2011-10-18 ENCOUNTER — Telehealth: Payer: Self-pay | Admitting: Gastroenterology

## 2011-10-18 LAB — CELIAC PANEL 10
Endomysial Screen: NEGATIVE
Gliadin IgA: 7.5 U/mL (ref <20)
IgA: 166 mg/dL (ref 69–380)
Tissue Transglut Ab: 13.9 U/mL (ref <20)
Tissue Transglutaminase Ab, IgA: 12.6 U/mL (ref <20)

## 2011-10-18 NOTE — Telephone Encounter (Signed)
Patient will check with her employer to see if she can reschedule to 10-20-11 at 10:30 and will call me back by this afternoon after 3 p.m.

## 2011-10-18 NOTE — Telephone Encounter (Signed)
I also rescheduled procedures in Epic.

## 2011-10-18 NOTE — Telephone Encounter (Signed)
Patient states she can do 10-20-11 procedure at 10:30 am. I went over the prep in detail with her since it is now an a.m. Prep and not a p.m. Time.

## 2011-10-20 ENCOUNTER — Ambulatory Visit (AMBULATORY_SURGERY_CENTER): Payer: BC Managed Care – PPO | Admitting: Gastroenterology

## 2011-10-20 ENCOUNTER — Encounter: Payer: Self-pay | Admitting: Gastroenterology

## 2011-10-20 VITALS — BP 158/74 | HR 93 | Temp 98.1°F | Resp 15 | Ht 65.0 in | Wt 243.0 lb

## 2011-10-20 DIAGNOSIS — K5901 Slow transit constipation: Secondary | ICD-10-CM

## 2011-10-20 DIAGNOSIS — D509 Iron deficiency anemia, unspecified: Secondary | ICD-10-CM

## 2011-10-20 DIAGNOSIS — R079 Chest pain, unspecified: Secondary | ICD-10-CM

## 2011-10-20 DIAGNOSIS — Z1211 Encounter for screening for malignant neoplasm of colon: Secondary | ICD-10-CM

## 2011-10-20 DIAGNOSIS — Z9884 Bariatric surgery status: Secondary | ICD-10-CM

## 2011-10-20 MED ORDER — SODIUM CHLORIDE 0.9 % IV SOLN: 500.0000 mL | INTRAVENOUS | Status: DC

## 2011-10-20 MED ORDER — PHOSPHATE ENEMA 7-19 GM/118ML RE ENEM: 1.0000 | ENEMA | Freq: Once | RECTAL | Status: DC | PRN

## 2011-10-20 NOTE — Progress Notes (Signed)
Patient did not experience any of the following events: a burn prior to discharge; a fall within the facility; wrong site/side/patient/procedure/implant event; or a hospital transfer or hospital admission upon discharge from the facility. (G8907) Patient did not have preoperative order for IV antibiotic SSI prophylaxis. (G8918)  

## 2011-10-20 NOTE — Progress Notes (Signed)
Physician stated prep was poor.

## 2011-10-20 NOTE — Patient Instructions (Addendum)
Discharge instructions given with verbal understanding. Office will call to schedule IRON INFUSION. Resume previous medications. YOU HAD AN ENDOSCOPIC PROCEDURE TODAY AT THE [facility]: Refer to the procedure report that was given to you for any specific questions about what was found during the examination.  If the procedure report does not answer your questions, please call your gastroenterologist to clarify.  If you requested that your care partner not be given the details of your procedure findings, then the procedure report has been included in a sealed envelope for you to review at your convenience later.  YOU SHOULD EXPECT: Some feelings of bloating in the abdomen. Passage of more gas than usual.  Walking can help get rid of the air that was put into your GI tract during the procedure and reduce the bloating. If you had a lower endoscopy (such as a colonoscopy or flexible sigmoidoscopy) you may notice spotting of blood in your stool or on the toilet paper. If you underwent a bowel prep for your procedure, then you may not have a normal bowel movement for a few days.  DIET: Your first meal following the procedure should be a light meal and then it is ok to progress to your normal diet.  A half-sandwich or bowl of soup is an example of a good first meal.  Heavy or fried foods are harder to digest and may make you feel nauseous or bloated.  Likewise meals heavy in dairy and vegetables can cause extra gas to form and this can also increase the bloating.  Drink plenty of fluids but you should avoid alcoholic beverages for 24 hours.  ACTIVITY: Your care partner should take you home directly after the procedure.  You should plan to take it easy, moving slowly for the rest of the day.  You can resume normal activity the day after the procedure however you should NOT DRIVE or use heavy machinery for 24 hours (because of the sedation medicines used during the test).    SYMPTOMS TO REPORT  IMMEDIATELY: A gastroenterologist can be reached at any hour.  During normal business hours, 8:30 AM to 5:00 PM Monday through Friday, call [phone].  After hours and on weekends, please call the GI answering service at [phone] who will take a message and have the physician on call contact you.   Following lower endoscopy (colonoscopy or flexible sigmoidoscopy):  Excessive amounts of blood in the stool  Significant tenderness or worsening of abdominal pains  Swelling of the abdomen that is new, acute  Fever of 100F or higher  FOLLOW UP: If any biopsies were taken you will be contacted by phone or by letter within the next 1-3 weeks.  Call your gastroenterologist if you have not heard about the biopsies in 3 weeks.  Our staff will call the home number listed on your records the next business day following your procedure to check on you and address any questions or concerns that you may have at that time regarding the information given to you following your procedure. This is a courtesy call and so if there is no answer at the home number and we have not heard from you through the emergency physician on call, we will assume that you have returned to your regular daily activities without incident.  SIGNATURES/CONFIDENTIALITY: You and/or your care partner have signed paperwork which will be entered into your electronic medical record.  These signatures attest to the fact that that the information above on your After  Visit Summary has been reviewed and is understood.  Full responsibility of the confidentiality of this discharge information lies with you and/or your care-partner.

## 2011-10-20 NOTE — Op Note (Signed)
[facility] [address] [place], [address]   COLONOSCOPY PROCEDURE REPORT  PATIENT: Shirley Morris, Shirley Morris  MR#: [id number] BIRTHDATE: [birth year] , 40  yrs. old GENDER: Female ENDOSCOPIST: Mardella Layman, MD, [facility] REFERRED BY: PROCEDURE DATE:  10/20/2011 PROCEDURE:   Colonoscopy, screening ASA CLASS:   Class II INDICATIONS:iron deficiency anemia, average risk patient for colon cancer, and constipation. MEDICATIONS: propofol (Diprivan) 200mg  IV  DESCRIPTION OF PROCEDURE:   After the risks and benefits and of the procedure were explained, informed consent was obtained.  A digital rectal exam revealed no abnormalities of the rectum.    The LB [id number]  endoscope was introduced through the anus and advanced to the cecum, which was identified by both the appendix and ileocecal valve .  The quality of the prep was poor, using MoviPrep .  The instrument was then slowly withdrawn as the colon was fully examined.     COLON FINDINGS: Melaosis coli pigmentary changes noted.   A normal appearing cecum, ileocecal valve, and appendiceal orifice were identified.  The ascending, hepatic flexure, transverse, splenic flexure, descending, sigmoid colon and rectum appeared unremarkable.  No polyps or cancers were seen.     Retroflexed views revealed no abnormalities.     The scope was then withdrawn from the patient and the procedure completed.  COMPLICATIONS: There were no complications. ENDOSCOPIC IMPRESSION: 1.   Melaosis coli pigmentary changes noted. 2.   Normal colon 3. clay-starch eater with associated iron deficiency and severe constipation RECOMMENDATIONS: 1.  continue current medications 2.  Upper endoscopy will be scheduled 3.  High fiber diet with liberal fluid intake. 4.  You should continue to follow colorectal cancer screening guidelines for "routine risk" patients with a repeat colonoscopy in 10 years.  There is no need for FOBT (stool)  testing for at least 5 years. 5.   probably will need IV iron infusion per prior gastric by-pass operation   REPEAT EXAM:  ON:GEXBMW, Chrissie Noa MD  _______________________________ eSigned:  Mardella Layman, MD, [facility] 10/20/2011 11:31 AM

## 2011-10-20 NOTE — Op Note (Signed)
[facility] [address] [place], [address]   ENDOSCOPY PROCEDURE REPORT  PATIENT: Shirley, Morris  MR#: [id number] BIRTHDATE: 01-Jun-1970 , 40  yrs. old GENDER: Female ENDOSCOPIST:David Hale Bogus, MD, [facility] REFERRED BY: PROCEDURE DATE:  10/20/2011 PROCEDURE:   EGD, diagnostic ASA CLASS:    Class II INDICATIONS: iron def and prior gastric bypass procedure. MEDICATION: There was residual sedation effect present from prior procedure and propofol (Diprivan) 200mg  IV TOPICAL ANESTHETIC:  DESCRIPTION OF PROCEDURE:   After the risks and benefits of the procedure were explained, informed consent was obtained.  The [facility] [id number]  endoscope was introduced through the mouth  and advanced to the second portion of the duodenum .  The instrument was slowly withdrawn as the mucosa was fully examined.      SMALL GASTRIC POUCH AND NORMAL ANASTOMOSIS WITHOUT BLEEDING OR MASSES OR ULCERATIONS NOTED.  ESOPHAGUS: The mucosa of the esophagus appeared normal.          The scope was then withdrawn from the patient and the procedure completed.  COMPLICATIONS: There were no complications.   ENDOSCOPIC IMPRESSION: 1.   SMALL GASTRIC POUCH AND NORMAL ANASTOMOSIS WITHOUT BLEEDING OR MASSES OR ULCERATIONS NOTED. PRIOR GASTRIC BYPASS PROCEDURE AT [facility].NO CAUSE OF IRON LOSS NOTED,PROBABLE IRON MALABSORPTION. 2.   The mucosa of the esophagus appeared normal  RECOMMENDATIONS: 1.  continue current medications 2.   IV IRON INFUSION NEEDED    _______________________________ eSignedMardella Layman, MD, [facility] 10/20/2011 11:38 AM   standard discharge

## 2011-10-21 ENCOUNTER — Telehealth: Payer: Self-pay | Admitting: *Deleted

## 2011-10-21 NOTE — Telephone Encounter (Signed)
Message left

## 2011-10-29 ENCOUNTER — Other Ambulatory Visit: Payer: Self-pay | Admitting: *Deleted

## 2011-10-29 ENCOUNTER — Telehealth: Payer: Self-pay | Admitting: *Deleted

## 2011-10-29 DIAGNOSIS — D509 Iron deficiency anemia, unspecified: Secondary | ICD-10-CM

## 2011-10-29 NOTE — Telephone Encounter (Signed)
Per 10/20/11 EGD recommended pt have IV Iron Infusion; lmom for pt to call back before scheduling.

## 2011-10-29 NOTE — Telephone Encounter (Signed)
Informed pt of her appt at [facility] on 11/16/11 at 0830am. Pt was given the number to change her appt if the need arises. Feraheme orders are in.

## 2011-11-02 ENCOUNTER — Encounter: Payer: BC Managed Care – PPO | Admitting: Obstetrics and Gynecology

## 2011-11-04 ENCOUNTER — Encounter: Payer: BC Managed Care – PPO | Admitting: Gastroenterology

## 2011-11-15 ENCOUNTER — Other Ambulatory Visit (HOSPITAL_COMMUNITY): Payer: Self-pay | Admitting: *Deleted

## 2011-11-16 ENCOUNTER — Encounter (HOSPITAL_COMMUNITY): Payer: Self-pay

## 2011-11-16 ENCOUNTER — Encounter (HOSPITAL_COMMUNITY)
Admission: RE | Admit: 2011-11-16 | Discharge: 2011-11-16 | Disposition: A | Discharge status: Home / Self Care | Payer: BC Managed Care – PPO | Source: Ambulatory Visit | Attending: Gastroenterology | Admitting: Gastroenterology

## 2011-11-16 VITALS — BP 125/74 | HR 87 | Temp 99.3°F | Resp 18 | Ht 65.0 in

## 2011-11-16 DIAGNOSIS — D509 Iron deficiency anemia, unspecified: Secondary | ICD-10-CM

## 2011-11-16 DIAGNOSIS — Z9884 Bariatric surgery status: Secondary | ICD-10-CM | POA: Insufficient documentation

## 2011-11-16 MED ORDER — FERUMOXYTOL INJECTION 510 MG/17 ML
510.0000 mg | Freq: Once | INTRAVENOUS | Status: AC
  Administered 2011-11-16: 510 mg via INTRAVENOUS
  Filled 2011-11-16: qty 17

## 2011-11-16 MED ORDER — SODIUM CHLORIDE 0.9 % IV SOLN
Freq: Once | INTRAVENOUS | Status: AC
  Administered 2011-11-16: 09:00:00 via INTRAVENOUS

## 2011-11-16 NOTE — Discharge Instructions (Signed)
Ferumoxytol injection What is this medicine? FERUMOXYTOL is an iron complex. Iron is used to make healthy red blood cells, which carry oxygen and nutrients throughout the body. This medicine is used to treat iron deficiency anemia in people with chronic kidney disease. This medicine may be used for other purposes; ask your health care provider or pharmacist if you have questions. What should I tell my health care provider before I take this medicine? They need to know if you have any of these conditions: -anemia not caused by low iron levels -high levels of iron in the blood -magnetic resonance imaging (MRI) test scheduled -an unusual or allergic reaction to iron, other medicines, foods, dyes, or preservatives -pregnant or trying to get pregnant -breast-feeding How should I use this medicine? This medicine is for infusion into a vein. It is given by a health care professional in a hospital or clinic setting. Talk to your pediatrician regarding the use of this medicine in children. Special care may be needed. Overdosage: If you think you've taken too much of this medicine contact a poison control center or emergency room at once. Overdosage: If you think you have taken too much of this medicine contact a poison control center or emergency room at once. NOTE: This medicine is only for you. Do not share this medicine with others. What if I miss a dose? It is important not to miss your dose. Call your doctor or health care professional if you are unable to keep an appointment. What may interact with this medicine? This medicine may interact with the following medications: -other iron products This list may not describe all possible interactions. Give your health care provider a list of all the medicines, herbs, non-prescription drugs, or dietary supplements you use. Also tell them if you smoke, drink alcohol, or use illegal drugs. Some items may interact with your medicine. What should I watch  for while using this medicine? Visit your doctor or healthcare professional regularly. Tell your doctor or healthcare professional if your symptoms do not start to get better or if they get worse. You may need blood work done while you are taking this medicine. You may need to follow a special diet. Talk to your doctor. Foods that contain iron include: whole grains/cereals, dried fruits, beans, or peas, leafy green vegetables, and organ meats (liver, kidney). What side effects may I notice from receiving this medicine? Side effects that you should report to your doctor or health care professional as soon as possible: -allergic reactions like skin rash, itching or hives, swelling of the face, lips, or tongue -breathing problems -changes in blood pressure -feeling faint or lightheaded, falls -fever or chills -flushing, sweating, or hot feelings -swelling of the ankles or feet Side effects that usually do not require medical attention (Report these to your doctor or health care professional if they continue or are bothersome.): -diarrhea -headache -nausea, vomiting -stomach pain This list may not describe all possible side effects. Call your doctor for medical advice about side effects. You may report side effects to FDA at 1-800-FDA-1088. Where should I keep my medicine? This drug is given in a hospital or clinic and will not be stored at home. NOTE: This sheet is a summary. It may not cover all possible information. If you have questions about this medicine, talk to your doctor, pharmacist, or health care provider.  2012, Elsevier/Gold Standard. (10/04/2007 9:48:25 PM) 

## 2011-11-17 ENCOUNTER — Other Ambulatory Visit: Payer: Self-pay | Admitting: *Deleted

## 2011-11-17 DIAGNOSIS — D649 Anemia, unspecified: Secondary | ICD-10-CM

## 2011-11-18 ENCOUNTER — Telehealth: Payer: Self-pay | Admitting: Obstetrics and Gynecology

## 2011-11-18 ENCOUNTER — Other Ambulatory Visit (HOSPITAL_COMMUNITY): Payer: Self-pay | Admitting: *Deleted

## 2011-11-18 NOTE — Telephone Encounter (Signed)
TC TO PT REGARDING MESSAGE. PT STATES THAT SHE HAS A VAGINAL DISCHARGE WITH NO IMPROVEMENT AFTER TAKING ROUND OF DIFLUCAN. INFORMED PT THAT WE CAN SCHEDULE AN APPT WITH EP TO EVALUATED VAGINAL DISCHARGE AND PT STATED IT WAS OKAY (PT IS [facility] PT ). SCHEDULE APPT WITH EP ON 11/18/11 AT 8:30 AM. PT VOICED UNDERSTANDING.

## 2011-11-19 ENCOUNTER — Encounter: Payer: Self-pay | Admitting: Obstetrics and Gynecology

## 2011-11-19 ENCOUNTER — Ambulatory Visit (INDEPENDENT_AMBULATORY_CARE_PROVIDER_SITE_OTHER): Payer: BC Managed Care – PPO | Admitting: Obstetrics and Gynecology

## 2011-11-19 VITALS — BP 124/78 | Temp 99.3°F | Wt 247.5 lb

## 2011-11-19 DIAGNOSIS — N760 Acute vaginitis: Secondary | ICD-10-CM

## 2011-11-19 DIAGNOSIS — R102 Pelvic and perineal pain: Secondary | ICD-10-CM

## 2011-11-19 DIAGNOSIS — N949 Unspecified condition associated with female genital organs and menstrual cycle: Secondary | ICD-10-CM

## 2011-11-19 DIAGNOSIS — N898 Other specified noninflammatory disorders of vagina: Secondary | ICD-10-CM

## 2011-11-19 DIAGNOSIS — B9689 Other specified bacterial agents as the cause of diseases classified elsewhere: Secondary | ICD-10-CM

## 2011-11-19 DIAGNOSIS — A499 Bacterial infection, unspecified: Secondary | ICD-10-CM

## 2011-11-19 DIAGNOSIS — Z113 Encounter for screening for infections with a predominantly sexual mode of transmission: Secondary | ICD-10-CM

## 2011-11-19 LAB — POCT WET PREP (WET MOUNT): Whiff Test: POSITIVE

## 2011-11-19 LAB — POCT URINALYSIS DIPSTICK
Bilirubin, UA: NEGATIVE
Blood, UA: NEGATIVE
Glucose, UA: NEGATIVE
Nitrite, UA: NEGATIVE
Spec Grav, UA: 1.02
pH, UA: 5

## 2011-11-19 MED ORDER — METRONIDAZOLE 500 MG PO TABS: 500.0000 mg | ORAL_TABLET | Freq: Two times a day (BID) | ORAL | Status: DC

## 2011-11-19 MED ORDER — FLUCONAZOLE 150 MG PO TABS: 150.0000 mg | ORAL_TABLET | Freq: Once | ORAL | Status: DC

## 2011-11-19 NOTE — Progress Notes (Signed)
Color: white Odor: yes Itching:no Thin:yes Thick:no Fever:no Dyspareunia:no Hx PID:no HX STD:no Pelvic Pain:yes slight pain on the left side x 2 days ago Desires Gc/CT:yes Desires HIV,RPR,HbsAG:no

## 2011-11-19 NOTE — Progress Notes (Addendum)
41 YO with malodorous discharge and h/o left pelvic pain a few days ago.  Pain described a achy, sudden and lasted about an hour then disappeared-none since.  Denies urnary tract symptoms, change in bowel movements, dyspareunia or fever.  Vaginal discharge has been present for two weeks.  She took a Diflucan but no relief. Would also like STD testing. [Patient uses Depo Provera for contraception]   O: Wet Prep:  pH-5.0,  whiff-positive,  many clue cells      U/A-negative  Abdomen: soft, non-tender Pelvic: EGBUS-wnl, vagina-moderate malodorous vaginal discharge, cervix-no lesions/on tenderness, uterus-tender, adnexae-tenderness on left side  A: Bacterial Vaginosis      Pelvic Pain  P: Metronidazole 500 mg #14 bid x 7 days no refills     Diflucan 150 mg  #1 1 po stat 1 refill      Perineal Hygiene     GC/CT-pending     Call after completing medication,  if pelvic pain persists will do ultrasound      RTO-as scheduled or prn  Bynum Mccullars, PA-C

## 2011-11-19 NOTE — Patient Instructions (Signed)
Bacterial Vaginosis Bacterial vaginosis (BV) is a vaginal infection where the normal balance of bacteria in the vagina is disrupted. The normal balance is then replaced by an overgrowth of certain bacteria. There are several different kinds of bacteria that can cause BV. BV is the most common vaginal infection in women of childbearing age. CAUSES   The cause of BV is not fully understood. BV develops when there is an increase or imbalance of harmful bacteria.  Some activities or behaviors can upset the normal balance of bacteria in the vagina and put women at increased risk including:  Having a new sex partner or multiple sex partners.  Douching.  Using an intrauterine device (IUD) for contraception.  It is not clear what role sexual activity plays in the development of BV. However, women that have never had sexual intercourse are rarely infected with BV. Women do not get BV from toilet seats, bedding, swimming pools or from touching objects around them.  SYMPTOMS   Grey vaginal discharge.  A fish-like odor with discharge, especially after sexual intercourse.  Itching or burning of the vagina and vulva.  Burning or pain with urination.  Some women have no signs or symptoms at all. DIAGNOSIS  Your caregiver must examine the vagina for signs of BV. Your caregiver will perform lab tests and look at the sample of vaginal fluid through a microscope. They will look for bacteria and abnormal cells (clue cells), a pH test higher than 4.5, and a positive amine test all associated with BV.  RISKS AND COMPLICATIONS   Pelvic inflammatory disease (PID).  Infections following gynecology surgery.  Developing HIV.  Developing herpes virus. TREATMENT  Sometimes BV will clear up without treatment. However, all women with symptoms of BV should be treated to avoid complications, especially if gynecology surgery is planned. Female partners generally do not need to be treated. However, BV may spread  between female sex partners so treatment is helpful in preventing a recurrence of BV.   BV may be treated with antibiotics. The antibiotics come in either pill or vaginal cream forms. Either can be used with nonpregnant or pregnant women, but the recommended dosages differ. These antibiotics are not harmful to the baby.  BV can recur after treatment. If this happens, a second round of antibiotics will often be prescribed.  Treatment is important for pregnant women. If not treated, BV can cause a premature delivery, especially for a pregnant woman who had a premature birth in the past. All pregnant women who have symptoms of BV should be checked and treated.  For chronic reoccurrence of BV, treatment with a type of prescribed gel vaginally twice a week is helpful. HOME CARE INSTRUCTIONS   Finish all medication as directed by your caregiver.  Do not have sex until treatment is completed.  Tell your sexual partner that you have a vaginal infection. They should see their caregiver and be treated if they have problems, such as a mild rash or itching.  Practice safe sex. Use condoms. Only have 1 sex partner. PREVENTION  Basic prevention steps can help reduce the risk of upsetting the natural balance of bacteria in the vagina and developing BV:  Do not have sexual intercourse (be abstinent).  Do not douche.  Use all of the medicine prescribed for treatment of BV, even if the signs and symptoms go away.  Tell your sex partner if you have BV. That way, they can be treated, if needed, to prevent reoccurrence. SEEK MEDICAL CARE IF:     Your symptoms are not improving after 3 days of treatment.  You have increased discharge, pain, or fever. MAKE SURE YOU:   Understand these instructions.  Will watch your condition.  Will get help right away if you are not doing well or get worse. FOR MORE INFORMATION  Division of STD Prevention (DSTDP), Centers for Disease Control and Prevention:  [employer] American Social Health Association (ASHA): www.ashastd.org  Document Released: 01/11/2005 Document Revised: 04/05/2011 Document Reviewed: 07/04/2008 [facility] Patient Information 2013 [place], [place].   Avoid: - excess soap on genital area (consider using plain oatmeal soap) - use of powder or sprays in genital area - douching - wearing underwear to bed (except with menses) - using more than is directed detergent when washing clothes - tight fitting garments around genital area - excess sugar intake

## 2011-11-22 ENCOUNTER — Encounter (HOSPITAL_COMMUNITY)
Admission: RE | Admit: 2011-11-22 | Discharge: 2011-11-22 | Disposition: A | Discharge status: Home / Self Care | Payer: BC Managed Care – PPO | Source: Ambulatory Visit | Attending: Gastroenterology | Admitting: Gastroenterology

## 2011-11-22 ENCOUNTER — Encounter (HOSPITAL_COMMUNITY): Payer: Self-pay

## 2011-11-22 VITALS — BP 133/80 | HR 91 | Temp 98.2°F | Resp 18

## 2011-11-22 DIAGNOSIS — D649 Anemia, unspecified: Secondary | ICD-10-CM

## 2011-11-22 MED ORDER — FERUMOXYTOL INJECTION 510 MG/17 ML
510.0000 mg | Freq: Once | INTRAVENOUS | Status: AC
  Administered 2011-11-22: 510 mg via INTRAVENOUS
  Filled 2011-11-22: qty 17

## 2011-11-22 MED ORDER — SODIUM CHLORIDE 0.9 % IV SOLN
Freq: Once | INTRAVENOUS | Status: AC
  Administered 2011-11-22: 15:00:00 via INTRAVENOUS

## 2011-11-23 ENCOUNTER — Telehealth: Payer: Self-pay | Admitting: Gastroenterology

## 2011-11-23 NOTE — Telephone Encounter (Signed)
Informed pt we need to wait 6 weeks and we will call her to come in and repeat labs; pt stated understanding.

## 2011-11-23 NOTE — Telephone Encounter (Signed)
Pt had Feraheme iron infusions on 11/16/11 and the 2nd on 11/22/11. When do we recheck labs and does pt need to continue daily po Iron? Thanks.

## 2011-11-23 NOTE — Telephone Encounter (Signed)
Wait 6 weeks

## 2011-12-30 ENCOUNTER — Other Ambulatory Visit (INDEPENDENT_AMBULATORY_CARE_PROVIDER_SITE_OTHER): Payer: BC Managed Care – PPO

## 2011-12-30 DIAGNOSIS — Z3009 Encounter for other general counseling and advice on contraception: Secondary | ICD-10-CM

## 2011-12-30 MED ORDER — MEDROXYPROGESTERONE ACETATE 150 MG/ML IM SUSP
150.0000 mg | Freq: Once | INTRAMUSCULAR | Status: AC
  Administered 2011-12-30: 150 mg via INTRAMUSCULAR

## 2011-12-30 NOTE — Progress Notes (Unsigned)
Next Depo due 03-22-2012 

## 2012-01-03 ENCOUNTER — Telehealth: Payer: Self-pay | Admitting: *Deleted

## 2012-01-03 DIAGNOSIS — E611 Iron deficiency: Secondary | ICD-10-CM

## 2012-01-03 NOTE — Telephone Encounter (Signed)
Informed pt of the need for repeat labs. Pt stated understanding and will try to come in this week.

## 2012-01-03 NOTE — Telephone Encounter (Signed)
lmom for pt to call back. She had an IV Iron Infusion on 11/22/11.

## 2012-01-03 NOTE — Telephone Encounter (Signed)
Message copied by Florene Glen on Mon Jan 03, 2012 10:33 AM ------      Message from: Florene Glen      Created: Tue Nov 23, 2011  3:59 PM       Recheck iron studies post feraheme

## 2012-01-05 ENCOUNTER — Other Ambulatory Visit (INDEPENDENT_AMBULATORY_CARE_PROVIDER_SITE_OTHER): Payer: BC Managed Care – PPO

## 2012-01-05 DIAGNOSIS — E611 Iron deficiency: Secondary | ICD-10-CM

## 2012-01-05 DIAGNOSIS — D509 Iron deficiency anemia, unspecified: Secondary | ICD-10-CM

## 2012-01-05 LAB — CBC WITH DIFFERENTIAL/PLATELET
Basophils Absolute: 0 K/uL (ref 0.0–0.1)
Basophils Relative: 0.3 % (ref 0.0–3.0)
Eosinophils Absolute: 0.4 K/uL (ref 0.0–0.7)
Eosinophils Relative: 5.1 % — ABNORMAL HIGH (ref 0.0–5.0)
HCT: 41 % (ref 36.0–46.0)
Hemoglobin: 13.7 g/dL (ref 12.0–15.0)
Lymphocytes Relative: 29.6 % (ref 12.0–46.0)
Lymphs Abs: 2.2 K/uL (ref 0.7–4.0)
MCHC: 33.4 g/dL (ref 30.0–36.0)
MCV: 86.3 fl (ref 78.0–100.0)
Monocytes Absolute: 0.4 K/uL (ref 0.1–1.0)
Monocytes Relative: 5.8 % (ref 3.0–12.0)
Neutro Abs: 4.4 K/uL (ref 1.4–7.7)
Neutrophils Relative %: 59.2 % (ref 43.0–77.0)
Platelets: 309 K/uL (ref 150.0–400.0)
RBC: 4.75 Mil/uL (ref 3.87–5.11)
RDW: 22.3 % — ABNORMAL HIGH (ref 11.5–14.6)
WBC: 7.5 K/uL (ref 4.5–10.5)

## 2012-01-06 LAB — FOLATE: Folate: 24.5 ng/mL (ref >5.9)

## 2012-01-06 LAB — IBC PANEL
Iron: 33 ug/dL — ABNORMAL LOW (ref 42–145)
Saturation Ratios: 10 % — ABNORMAL LOW (ref 20.0–50.0)
Transferrin: 234.7 mg/dL (ref 212.0–360.0)

## 2012-01-06 LAB — FERRITIN: Ferritin: 42 ng/mL (ref 10.0–291.0)

## 2012-01-07 ENCOUNTER — Telehealth: Payer: Self-pay | Admitting: *Deleted

## 2012-01-07 NOTE — Telephone Encounter (Signed)
lmom for pt to call back. She has several refills left on her Iron order.

## 2012-01-07 NOTE — Telephone Encounter (Signed)
Message copied by Florene Glen on Fri Jan 07, 2012  9:54 AM ------      Message from: Jarold Motto, DAVID R      Created: Thu Jan 06, 2012  8:57 AM       Tandem daily for 3 months.

## 2012-01-11 NOTE — Telephone Encounter (Signed)
Informed pt of improvement of HGB with IV IRON and to continue with Tandem for 3 more months. Pt asked about her B12 level and informed her her level was high on 10/15/11 draw, so that isn't an issue. Pt stated she takes a B12 supplement so maybe she should back off; advised her to decrease to maybe QOD; pt stated understanding.

## 2012-02-15 ENCOUNTER — Other Ambulatory Visit: Payer: Self-pay | Admitting: Obstetrics and Gynecology

## 2012-02-15 DIAGNOSIS — N644 Mastodynia: Secondary | ICD-10-CM

## 2012-02-15 DIAGNOSIS — N6489 Other specified disorders of breast: Secondary | ICD-10-CM

## 2012-03-03 ENCOUNTER — Other Ambulatory Visit: Payer: Self-pay | Admitting: Interventional Cardiology

## 2012-03-03 ENCOUNTER — Other Ambulatory Visit: Payer: Self-pay | Admitting: Obstetrics and Gynecology

## 2012-03-03 DIAGNOSIS — N644 Mastodynia: Secondary | ICD-10-CM

## 2012-03-03 DIAGNOSIS — N6489 Other specified disorders of breast: Secondary | ICD-10-CM

## 2012-03-06 ENCOUNTER — Encounter: Payer: BC Managed Care – PPO | Admitting: Obstetrics and Gynecology

## 2012-03-13 ENCOUNTER — Ambulatory Visit
Admission: RE | Admit: 2012-03-13 | Discharge: 2012-03-13 | Disposition: A | Discharge status: Home / Self Care | Payer: BC Managed Care – PPO | Source: Ambulatory Visit | Attending: Obstetrics and Gynecology | Admitting: Obstetrics and Gynecology

## 2012-03-13 DIAGNOSIS — N6489 Other specified disorders of breast: Secondary | ICD-10-CM

## 2012-03-13 DIAGNOSIS — N644 Mastodynia: Secondary | ICD-10-CM

## 2012-03-20 ENCOUNTER — Other Ambulatory Visit: Payer: Self-pay | Admitting: Obstetrics and Gynecology

## 2012-03-22 ENCOUNTER — Other Ambulatory Visit: Payer: BC Managed Care – PPO

## 2012-03-22 DIAGNOSIS — Z3009 Encounter for other general counseling and advice on contraception: Secondary | ICD-10-CM

## 2012-03-22 MED ORDER — MEDROXYPROGESTERONE ACETATE 150 MG/ML IM SUSP
150.0000 mg | Freq: Once | INTRAMUSCULAR | Status: AC
  Administered 2012-03-22: 150 mg via INTRAMUSCULAR

## 2012-03-22 NOTE — Progress Notes (Unsigned)
Next Depo due 06-13-2012

## 2012-08-18 ENCOUNTER — Other Ambulatory Visit: Payer: Self-pay

## 2012-08-18 DIAGNOSIS — Z1231 Encounter for screening mammogram for malignant neoplasm of breast: Secondary | ICD-10-CM

## 2012-09-15 ENCOUNTER — Ambulatory Visit
Admission: RE | Admit: 2012-09-15 | Discharge: 2012-09-15 | Disposition: A | Discharge status: Home / Self Care | Payer: BC Managed Care – PPO | Source: Ambulatory Visit

## 2012-09-15 DIAGNOSIS — Z1231 Encounter for screening mammogram for malignant neoplasm of breast: Secondary | ICD-10-CM

## 2012-11-02 IMAGING — CR DG CHEST 2V
2 series · 2 of 2 positions shown · non-contrast
Comparison: None.

CLINICAL DATA: Cough, fever, congestion, shortness of breath

CHEST - 2 VIEW

[view not recorded (1 of 2)]
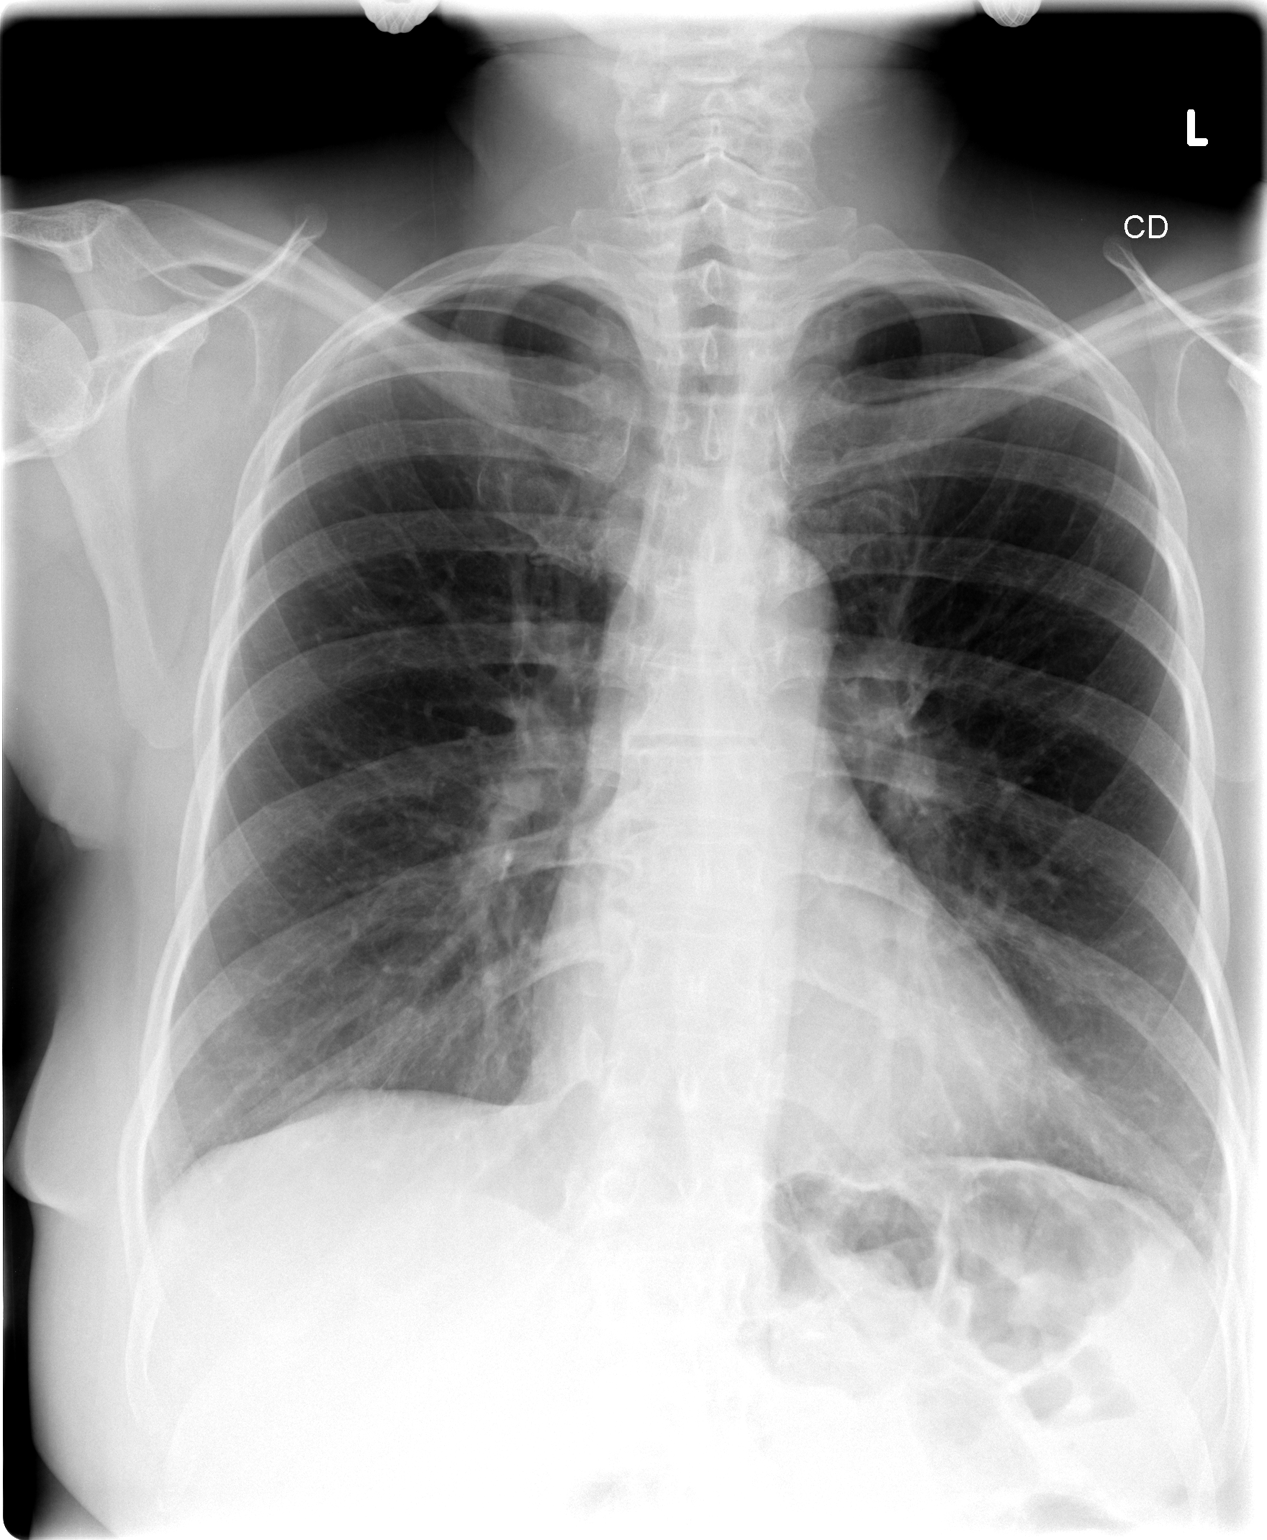

[view not recorded (2 of 2)]
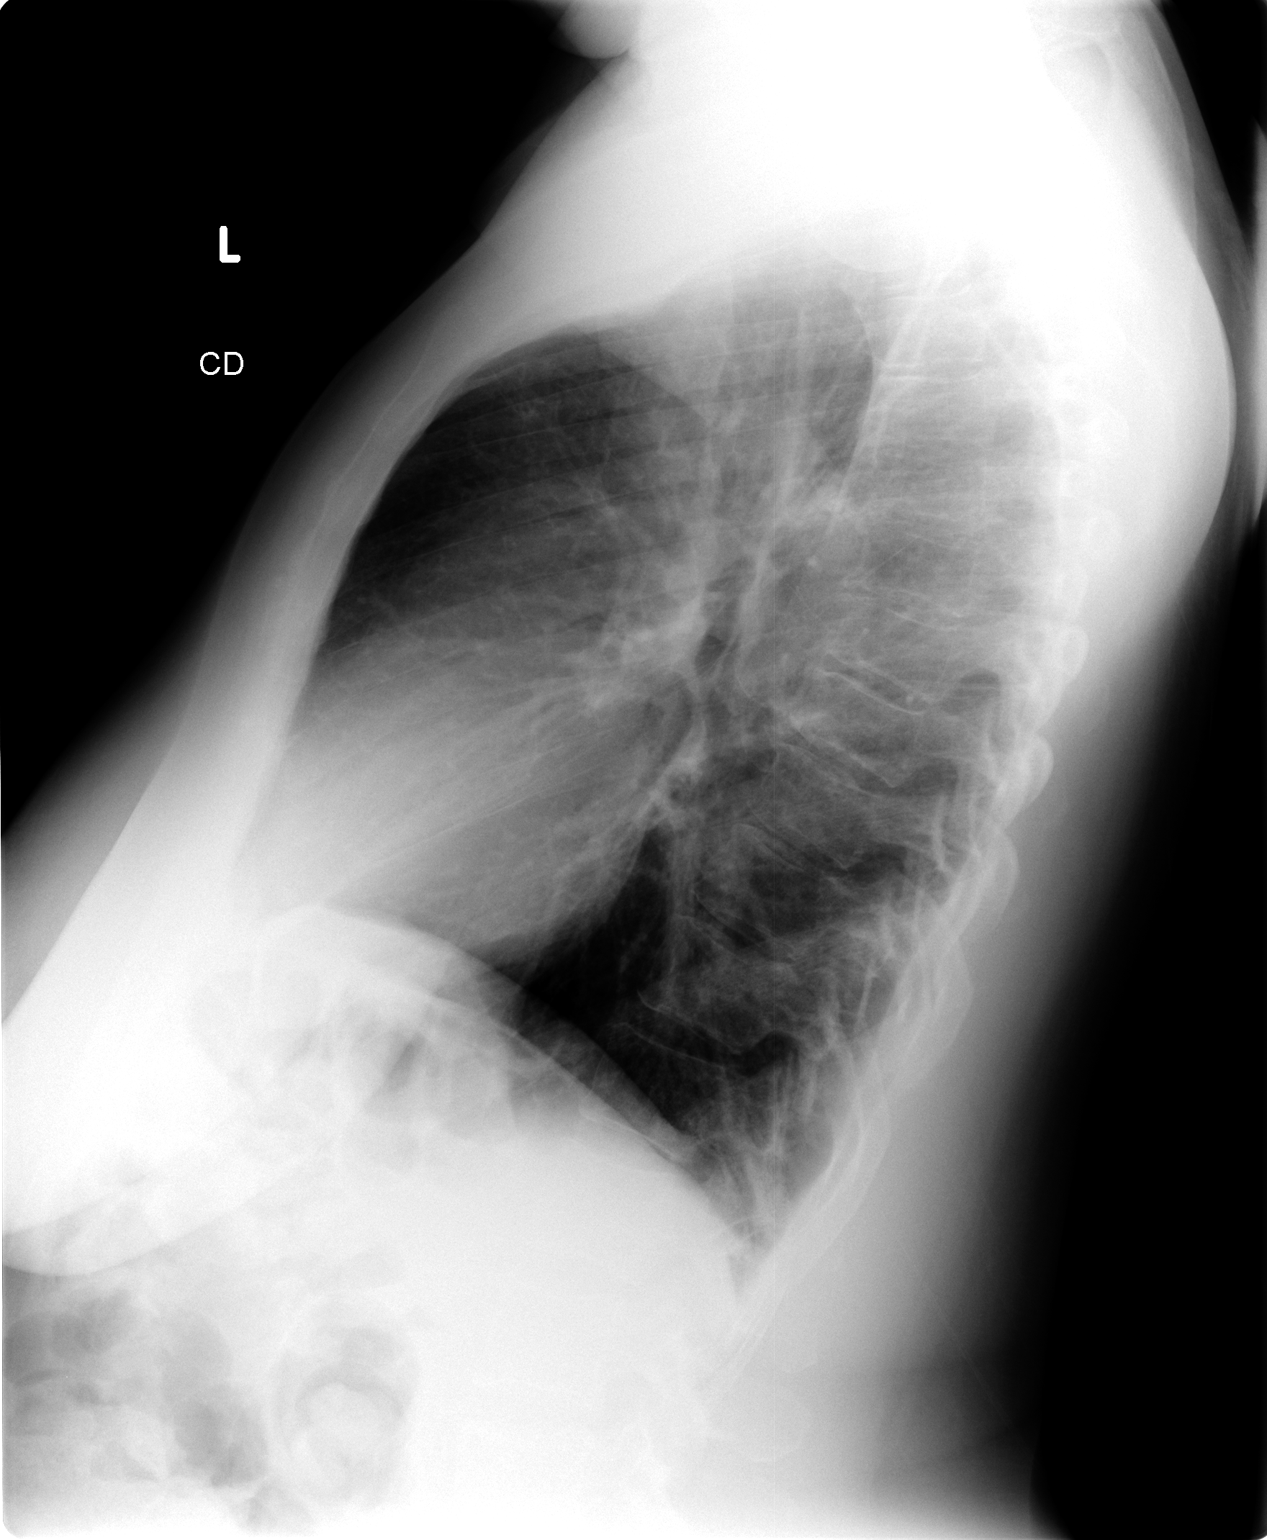

[2 of 2 positions shown; findings below may reference images not displayed]

FINDINGS: The lungs are clear and slightly hyperaerated.  Mild
peribronchial thickening is present.  Mediastinal contours appear
normal.  The heart is within normal limits in size.  No bony
abnormality is seen.
IMPRESSION: No pneumonia.  Peribronchial thickening may indicate bronchitis.

## 2013-01-26 ENCOUNTER — Encounter (HOSPITAL_COMMUNITY): Payer: Self-pay | Admitting: Emergency Medicine

## 2013-01-26 ENCOUNTER — Emergency Department (HOSPITAL_COMMUNITY)
Admission: EM | Admit: 2013-01-26 | Discharge: 2013-01-26 | Disposition: A | Discharge status: Home / Self Care | Payer: BC Managed Care – PPO | Attending: Emergency Medicine | Admitting: Emergency Medicine

## 2013-01-26 DIAGNOSIS — J329 Chronic sinusitis, unspecified: Secondary | ICD-10-CM

## 2013-01-26 DIAGNOSIS — Z87828 Personal history of other (healed) physical injury and trauma: Secondary | ICD-10-CM | POA: Insufficient documentation

## 2013-01-26 DIAGNOSIS — I10 Essential (primary) hypertension: Secondary | ICD-10-CM | POA: Insufficient documentation

## 2013-01-26 DIAGNOSIS — Z792 Long term (current) use of antibiotics: Secondary | ICD-10-CM | POA: Insufficient documentation

## 2013-01-26 DIAGNOSIS — Z79899 Other long term (current) drug therapy: Secondary | ICD-10-CM | POA: Insufficient documentation

## 2013-01-26 DIAGNOSIS — K5900 Constipation, unspecified: Secondary | ICD-10-CM | POA: Insufficient documentation

## 2013-01-26 DIAGNOSIS — G43909 Migraine, unspecified, not intractable, without status migrainosus: Secondary | ICD-10-CM | POA: Insufficient documentation

## 2013-01-26 MED ORDER — HYDROMORPHONE HCL PF 1 MG/ML IJ SOLN
1.0000 mg | Freq: Once | INTRAMUSCULAR | Status: AC
Start: 2013-01-26 — End: 2013-01-26
  Administered 2013-01-26: 1 mg via INTRAVENOUS
  Filled 2013-01-26: qty 1

## 2013-01-26 MED ORDER — AZITHROMYCIN 250 MG PO TABS: 250.0000 mg | ORAL_TABLET | Freq: Every day | ORAL | Status: DC

## 2013-01-26 MED ORDER — DEXAMETHASONE SODIUM PHOSPHATE 10 MG/ML IJ SOLN
10.0000 mg | Freq: Once | INTRAMUSCULAR | Status: AC
  Administered 2013-01-26: 10 mg via INTRAVENOUS
  Filled 2013-01-26: qty 1

## 2013-01-26 MED ORDER — DIPHENHYDRAMINE HCL 50 MG/ML IJ SOLN
25.0000 mg | Freq: Once | INTRAMUSCULAR | Status: AC
  Administered 2013-01-26: 25 mg via INTRAVENOUS
  Filled 2013-01-26: qty 1

## 2013-01-26 MED ORDER — ONDANSETRON HCL 4 MG/2ML IJ SOLN
4.0000 mg | Freq: Once | INTRAMUSCULAR | Status: AC
  Administered 2013-01-26: 4 mg via INTRAVENOUS
  Filled 2013-01-26: qty 2

## 2013-01-26 MED ORDER — METOCLOPRAMIDE HCL 5 MG/ML IJ SOLN
10.0000 mg | Freq: Once | INTRAMUSCULAR | Status: AC
  Administered 2013-01-26: 10 mg via INTRAVENOUS
  Filled 2013-01-26: qty 2

## 2013-01-26 NOTE — ED Provider Notes (Addendum)
CSN: [id number]     Arrival date & time 01/26/13  1213 History   First MD Initiated Contact with Patient 01/26/13 1320     Chief Complaint  Patient presents with  . Migraine   (Consider location/radiation/quality/duration/timing/severity/associated sxs/prior Treatment) Patient is a 43 y.o. female presenting with migraines. The history is provided by the patient.  Migraine This is a recurrent problem. The current episode started 2 days ago. The problem occurs constantly. The problem has been gradually worsening. Pertinent negatives include no chest pain and no shortness of breath. Associated symptoms comments: Sinus congestion, rhinorrhea.  No fever, cough or SOB.Marland Kitchen. Exacerbated by: bright light and loud noise. Nothing relieves the symptoms. Treatments tried: inderal and muscle relaxer and imipramine. The treatment provided no relief.    Past Medical History  Diagnosis Date  . SVT (supraventricular tachycardia)   . Hypertension   . Migraine   . Palpitations   . Chest pain     Negative stress echo in February of 2011  . Tear of meniscus of right knee   . Injury of thumb, right   . Cesarean delivery delivered     for fetal distress  . Family history of colon cancer   . Constipation    Past Surgical History  Procedure Laterality Date  . Knee arthroscopy  2011  . [place] echocardiography  11/26/2008    EF 55-60%  . 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 Cesarean section     Family History  Problem Relation Age of Onset  . Coronary artery disease Father   . Diabetes Father   . Hypertension Father   . Colon cancer Maternal Uncle   . Colon cancer Cousin     First cousin maternal side    History  Substance Use Topics  . Smoking status: Never Smoker   . Smokeless tobacco: Never Used  . Alcohol Use: No   OB History   Grav Para Term Preterm Abortions TAB SAB Ect Mult Living   1 1        1      Review of Systems  HENT: Positive for rhinorrhea, sinus pressure and sore throat.  Negative for trouble swallowing and voice change.   Respiratory: Negative for cough and shortness of breath.   Cardiovascular: Negative for chest pain.  All other systems reviewed and are negative.    Allergies  Review of patient's allergies indicates no known allergies.  Home Medications   Current Outpatient Rx  Name  Route  Sig  Dispense  Refill  . calcium citrate (CALCITRATE - DOSED IN MG ELEMENTAL CALCIUM) 950 MG tablet   Oral   Take 1 tablet by mouth daily.           . Cholecalciferol (VITAMIN D PO)   Oral   Take by mouth.           . Cyanocobalamin (VITAMIN B-12 PO)   Oral   Take by mouth daily.           Marland Kitchen. EXPIRED: estradiol (ESTRACE) 1 MG tablet   Oral   Take 2 tablets (2 mg total) by mouth daily.   60 tablet   0   . fluconazole (DIFLUCAN) 150 MG tablet   Oral   Take 1 tablet (150 mg total) by mouth once.   1 tablet   1   . Hyoscyamine-Phenyltoloxamine (DIGEX NF) 9.5621-300.0625-15 MG CAPS   Oral   Take 0.0625 mg by mouth 2 (two) times daily.   12  each   0   . imipramine (TOFRANIL) 25 MG tablet   Oral   Take 50 mg by mouth at bedtime.           . Iron Polysacch Cmplx-B12-FA (FERREX 150 FORTE) 150-0.025-1 MG CAPS   Oral   Take 1 capsule by mouth daily.   30 each   11   . MedroxyPROGESTERone Acetate (DEPO-PROVERA IM)   Intramuscular   Inject into the muscle. Taking every 3 months          . metroNIDAZOLE (FLAGYL) 500 MG tablet   Oral   Take 1 tablet (500 mg total) by mouth 2 (two) times daily.   14 tablet   0   . Multiple Vitamin (MULTIVITAMIN) tablet   Oral   Take 1 tablet by mouth daily.           . pantoprazole (PROTONIX) 40 MG tablet   Oral   Take 1 tablet (40 mg total) by mouth daily.   30 tablet   11   . phosphate (FLEET) enema   Rectal   Place 1 enema rectally once as needed for constipation.   135 mL   0   . propranolol (INDERAL) 10 MG tablet   Oral   Take 10 mg by mouth as needed.           .  triamterene-hydrochlorothiazide (MAXZIDE-25) 37.5-25 MG per tablet   Oral   Take 0.5 tablets by mouth daily.            BP 130/79  Temp(Src) 98.1 F (36.7 C) (Oral)  Resp 16  SpO2 95% Physical Exam  Nursing note and vitals reviewed. Constitutional: She is oriented to person, place, and time. She appears well-developed and well-nourished. No distress.  HENT:  Head: Normocephalic and atraumatic.  Nose: Mucosal edema present. Right sinus exhibits maxillary sinus tenderness and frontal sinus tenderness. Left sinus exhibits maxillary sinus tenderness and frontal sinus tenderness.  Eyes: EOM are normal. Pupils are equal, round, and reactive to light.  Fundoscopic exam:      The right eye shows no papilledema.       The left eye shows no papilledema.  Neck: Normal range of motion. Neck supple.  Cardiovascular: Normal rate, regular rhythm, normal heart sounds and intact distal pulses.  Exam reveals no friction rub.   No murmur heard. Pulmonary/Chest: Effort normal and breath sounds normal. She has no wheezes. She has no rales.  Abdominal: Soft. Bowel sounds are normal. She exhibits no distension. There is no tenderness. There is no rebound and no guarding.  Musculoskeletal: Normal range of motion. She exhibits no tenderness.  No edema  Lymphadenopathy:    She has no cervical adenopathy.  Neurological: She is alert and oriented to person, place, and time. She has normal strength. No cranial nerve deficit or sensory deficit. Gait normal.  photophobia  Skin: Skin is warm and dry. No rash noted.  Psychiatric: She has a normal mood and affect. Her behavior is normal.    ED Course  Procedures (including critical care time) Labs Review Labs Reviewed - No data to display Imaging Review No results found.  EKG Interpretation   None       MDM   1. Migraine   2. Sinusitis     Pt with typical migraine HA without sx suggestive of SAH(sudden onset, worst of life, or deficits),  infection, or cavernous vein thrombosis.  Normal neuro exam and vital signs. Will give HA cocktail and on  re-eval.  Secondly pt has had URI sx and sinus congestion going on 2 weeks now which most likely triggered the headache.  No fever, cough or SOB but does have sinus tenderness.  Will treat with z-pack.  HA improved from 10/10 to 7/10.  Given dose of dilaudid.  4:03 PM Pt's headache is much improved and she was d/ced home.  Gwyneth Sprout, MD 01/26/13 1603  Gwyneth Sprout, MD 01/26/13 1606

## 2013-01-26 NOTE — ED Notes (Signed)
Pt states she is having a migraine. Pt has hx of migraines. Pt takes prophylactic migraine medicine every night and flexeril when she has a migraine. Pt states that her meds are not working for this  migraine. Pt is photosensitive. Neuro intact. Pt states before Christmas she had a the flu and took cold medicines. Pt denies any symptoms of the flu right now and her oral temp was 98.1

## 2013-01-26 NOTE — ED Notes (Signed)
Pt reports migraine x 2 days, nausea and photosensitivity.

## 2013-03-18 ENCOUNTER — Emergency Department (HOSPITAL_COMMUNITY)
Admission: EM | Admit: 2013-03-18 | Discharge: 2013-03-18 | Disposition: A | Discharge status: Home / Self Care | Payer: BC Managed Care – PPO | Source: Home / Self Care | Attending: Family Medicine | Admitting: Family Medicine

## 2013-03-18 ENCOUNTER — Encounter (HOSPITAL_COMMUNITY): Payer: Self-pay | Admitting: Emergency Medicine

## 2013-03-18 DIAGNOSIS — J020 Streptococcal pharyngitis: Secondary | ICD-10-CM

## 2013-03-18 LAB — POCT PREGNANCY, URINE: PREG TEST UR: NEGATIVE

## 2013-03-18 LAB — POCT URINALYSIS DIP (DEVICE)
BILIRUBIN URINE: NEGATIVE
GLUCOSE, UA: 100 mg/dL — AB
Hgb urine dipstick: NEGATIVE
Nitrite: NEGATIVE
Protein, ur: NEGATIVE mg/dL
Urobilinogen, UA: 0.2 mg/dL (ref 0.0–1.0)
pH: 5.5 (ref 5.0–8.0)

## 2013-03-18 LAB — POCT RAPID STREP A: STREPTOCOCCUS, GROUP A SCREEN (DIRECT): POSITIVE — AB

## 2013-03-18 MED ORDER — DEXAMETHASONE SODIUM PHOSPHATE 10 MG/ML IJ SOLN
INTRAMUSCULAR | Status: AC
  Filled 2013-03-18: qty 1

## 2013-03-18 MED ORDER — KETOROLAC TROMETHAMINE 60 MG/2ML IM SOLN
60.0000 mg | Freq: Once | INTRAMUSCULAR | Status: AC
  Administered 2013-03-18: 60 mg via INTRAMUSCULAR

## 2013-03-18 MED ORDER — DEXAMETHASONE SODIUM PHOSPHATE 10 MG/ML IJ SOLN
10.0000 mg | Freq: Once | INTRAMUSCULAR | Status: AC
  Administered 2013-03-18: 10 mg via INTRAMUSCULAR

## 2013-03-18 MED ORDER — KETOROLAC TROMETHAMINE 60 MG/2ML IM SOLN
INTRAMUSCULAR | Status: AC
  Filled 2013-03-18: qty 2

## 2013-03-18 MED ORDER — PENICILLIN G BENZATHINE 1200000 UNIT/2ML IM SUSP
INTRAMUSCULAR | Status: AC
  Filled 2013-03-18: qty 2

## 2013-03-18 MED ORDER — AMOXICILLIN 500 MG PO CAPS: 1000.0000 mg | ORAL_CAPSULE | Freq: Two times a day (BID) | ORAL | Status: DC

## 2013-03-18 MED ORDER — PENICILLIN G BENZATHINE 1200000 UNIT/2ML IM SUSP
1.2000 10*6.[IU] | Freq: Once | INTRAMUSCULAR | Status: AC
  Administered 2013-03-18: 1.2 10*6.[IU] via INTRAMUSCULAR

## 2013-03-18 MED ORDER — HYDROCODONE-ACETAMINOPHEN 7.5-325 MG/15ML PO SOLN: 10.0000 mL | ORAL | Status: DC | PRN

## 2013-03-18 NOTE — Discharge Instructions (Signed)
Strep Throat  Strep throat is an infection of the throat caused by a bacteria named Streptococcus pyogenes. Your caregiver may call the infection streptococcal "tonsillitis" or "pharyngitis" depending on whether there are signs of inflammation in the tonsils or back of the throat. Strep throat is most common in children aged 43 15 years during the cold months of the year, but it can occur in people of any age during any season. This infection is spread from person to person (contagious) through coughing, sneezing, or other close contact.  SYMPTOMS   · Fever or chills.  · Painful, swollen, red tonsils or throat.  · Pain or difficulty when swallowing.  · White or yellow spots on the tonsils or throat.  · Swollen, tender lymph nodes or "glands" of the neck or under the jaw.  · Red rash all over the body (rare).  DIAGNOSIS   Many different infections can cause the same symptoms. A test must be done to confirm the diagnosis so the right treatment can be given. A "rapid strep test" can help your caregiver make the diagnosis in a few minutes. If this test is not available, a light swab of the infected area can be used for a throat culture test. If a throat culture test is done, results are usually available in a day or two.  TREATMENT   Strep throat is treated with antibiotic medicine.  HOME CARE INSTRUCTIONS   · Gargle with 1 tsp of salt in 1 cup of warm water, 3 4 times per day or as needed for comfort.  · Family members who also have a sore throat or fever should be tested for strep throat and treated with antibiotics if they have the strep infection.  · Make sure everyone in your household washes their hands well.  · Do not share food, drinking cups, or personal items that could cause the infection to spread to others.  · You may need to eat a soft food diet until your sore throat gets better.  · Drink enough water and fluids to keep your urine clear or pale yellow. This will help prevent dehydration.  · Get plenty of  rest.  · Stay home from school, daycare, or work until you have been on antibiotics for 24 hours.  · Only take over-the-counter or prescription medicines for pain, discomfort, or fever as directed by your caregiver.  · If antibiotics are prescribed, take them as directed. Finish them even if you start to feel better.  SEEK MEDICAL CARE IF:   · The glands in your neck continue to enlarge.  · You develop a rash, cough, or earache.  · You cough up green, yellow-brown, or bloody sputum.  · You have pain or discomfort not controlled by medicines.  · Your problems seem to be getting worse rather than better.  SEEK IMMEDIATE MEDICAL CARE IF:   · You develop any new symptoms such as vomiting, severe headache, stiff or painful neck, chest pain, shortness of breath, or trouble swallowing.  · You develop severe throat pain, drooling, or changes in your voice.  · You develop swelling of the neck, or the skin on the neck becomes red and tender.  · You have a fever.  · You develop signs of dehydration, such as fatigue, dry mouth, and decreased urination.  · You become increasingly sleepy, or you cannot wake up completely.  Document Released: 01/09/2000 Document Revised: 12/29/2011 Document Reviewed: 03/12/2010  [employer]® Patient Information ©2014 [employer].

## 2013-03-18 NOTE — ED Provider Notes (Signed)
CSN: [id number]     Arrival date & time 03/18/13  1445 History   First MD Initiated Contact with Patient 03/18/13 1551     Chief Complaint  Patient presents with  . Generalized Body Aches  . Sore Throat     (Consider location/radiation/quality/duration/timing/severity/associated sxs/prior Treatment) HPI Comments: 43 year old female presents complaining of severe body aches, back pain, sore throat, fever. This all began this morning. She notes that she has recent exposure to her husband who has had strep throat. She has taken ibuprofen with no relief. She rates her level of discomfort as 10 out of 10. She has not had any nausea or vomiting, dysuria, hematuria, cough. She has a history of gastric bypass surgery.   Patient is a 43 y.o. female presenting with pharyngitis.  Sore Throat Pertinent negatives include no chest pain, no abdominal pain and no shortness of breath.    Past Medical History  Diagnosis Date  . SVT (supraventricular tachycardia)   . Hypertension   . Migraine   . Palpitations   . Chest pain     Negative stress echo in February of 2011  . Tear of meniscus of right knee   . Injury of thumb, right   . Cesarean delivery delivered     for fetal distress  . Family history of colon cancer   . Constipation    Past Surgical History  Procedure Laterality Date  . Knee arthroscopy  2011  . US echocardiography  11/26/2008    EF 55-60%  . 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 Cesarean section     Family History  Problem Relation Age of Onset  . Coronary artery disease Father   . Diabetes Father   . Hypertension Father   . Colon cancer Maternal Uncle   . Colon cancer Cousin     First cousin maternal side    History  Substance Use Topics  . Smoking status: Never Smoker   . Smokeless tobacco: Never Used  . Alcohol Use: No   OB History   Grav Para Term Preterm Abortions TAB SAB Ect Mult Living   1 1        1      Review of Systems  Constitutional:  Positive for fever, chills and fatigue.  HENT: Positive for sore throat. Negative for ear pain and sinus pressure.   Eyes: Negative for visual disturbance.  Respiratory: Negative for cough, chest tightness, shortness of breath and wheezing.   Cardiovascular: Negative for chest pain, palpitations and leg swelling.  Gastrointestinal: Negative for nausea, vomiting and abdominal pain.  Endocrine: Negative for polydipsia and polyuria.  Genitourinary: Negative for dysuria, urgency and frequency.  Musculoskeletal: Positive for arthralgias, back pain and myalgias.  Skin: Negative for rash.  Neurological: Negative for dizziness, weakness and light-headedness.      Allergies  Review of patient's allergies indicates no known allergies.  Home Medications   Current Outpatient Rx  Name  Route  Sig  Dispense  Refill  . amoxicillin (AMOXIL) 500 MG capsule   Oral   Take 2 capsules (1,000 mg total) by mouth 2 (two) times daily.   28 capsule   0   . azithromycin (ZITHROMAX) 250 MG tablet   Oral   Take 1 tablet (250 mg total) by mouth daily. Take first 2 tablets together, then 1 every day until finished.   6 tablet   0   . calcium citrate (CALCITRATE - DOSED IN MG ELEMENTAL CALCIUM) 950 MG tablet  Oral   Take 1 tablet by mouth daily.           . Cholecalciferol (VITAMIN D PO)   Oral   Take 1 tablet by mouth daily.          . Cyanocobalamin (VITAMIN B-12 PO)   Oral   Take 1 tablet by mouth daily.          . fluocinonide cream (LIDEX) 0.05 %   Topical   Apply 1 application topically 2 (two) times daily.         Marland Kitchen. HYDROcodone-acetaminophen (HYCET) 7.5-325 mg/15 ml solution   Oral   Take 10 mLs by mouth every 4 (four) hours as needed for moderate pain.   120 mL   0   . HYDROcodone-acetaminophen (NORCO/VICODIN) 5-325 MG per tablet   Oral   Take 1 tablet by mouth every 6 (six) hours as needed.         Marland Kitchen. imipramine (TOFRANIL) 25 MG tablet   Oral   Take 50 mg by mouth at  bedtime.           . medroxyPROGESTERone (DEPO-PROVERA) 150 MG/ML injection   Intramuscular   Inject 150 mg into the muscle every 3 (three) months.         . Multiple Vitamin (MULTIVITAMIN) tablet   Oral   Take 1 tablet by mouth daily.           Marland Kitchen. triamterene-hydrochlorothiazide (MAXZIDE-25) 37.5-25 MG per tablet   Oral   Take 0.5 tablets by mouth daily.            BP 145/87  Pulse 136  Temp(Src) 102 F (38.9 C) (Oral)  Resp 20  SpO2 98% Physical Exam  Nursing note and vitals reviewed. Constitutional: She is oriented to person, place, and time. Vital signs are normal. She appears well-developed and well-nourished. She appears ill. She appears distressed.  HENT:  Head: Normocephalic and atraumatic.  Nose: Right sinus exhibits maxillary sinus tenderness. Right sinus exhibits no frontal sinus tenderness. Left sinus exhibits maxillary sinus tenderness. Left sinus exhibits no frontal sinus tenderness.  Mouth/Throat: Uvula is midline and mucous membranes are normal. Uvula swelling present. Oropharyngeal exudate, posterior oropharyngeal edema (mild) and posterior oropharyngeal erythema present. No tonsillar abscesses.  Cardiovascular: Regular rhythm, normal heart sounds and normal pulses.  Tachycardia present.  Exam reveals no gallop.   No murmur heard. Pulmonary/Chest: Effort normal and breath sounds normal. No respiratory distress.  Abdominal: There is CVA tenderness (Equal bilaterally, probably musculoskeletal back pain).  Musculoskeletal:       Lumbar back: She exhibits tenderness (paraspinous musculature).  Lymphadenopathy:       Head (right side): Tonsillar adenopathy present.       Head (left side): Tonsillar adenopathy present.    She has cervical adenopathy.       Right cervical: Posterior cervical adenopathy present.       Left cervical: Posterior cervical adenopathy present.  Neurological: She is alert and oriented to person, place, and time. She has normal  strength. Coordination normal.  Skin: Skin is warm and dry. No rash noted. She is not diaphoretic.  Psychiatric: She has a normal mood and affect. Judgment normal.    ED Course  Procedures (including critical care time) Labs Review Labs Reviewed  POCT URINALYSIS DIP (DEVICE) - Abnormal; Notable for the following:    Glucose, UA 100 (*)    Ketones, ur TRACE (*)    Leukocytes, UA TRACE (*)    All other  components within normal limits  POCT RAPID STREP A ([facility] ONLY) - Abnormal; Notable for the following:    Streptococcus, Group A Screen (Direct) POSITIVE (*)    All other components within normal limits  URINE CULTURE  POCT PREGNANCY, URINE   Imaging Review No results found.    MDM   Final diagnoses:  Strep throat    Due to her level of discomfort, will give an injection of Bicillin here and discharge with amoxicillin. Also treating symptomatically, Decadron and Toradol given here and discharged with Norco. She seems sicker than she should be with strep, but her symptoms and physical exam are consistent with strep. She will followup tomorrow if she is not getting any better.  Meds ordered this encounter  Medications  . penicillin g benzathine (BICILLIN LA) 1200000 UNIT/2ML injection 1.2 Million Units    Sig:     Order Specific Question:  Antibiotic Indication:    Answer:  Pharyngitis  . ketorolac (TORADOL) injection 60 mg    Sig:   . dexamethasone (DECADRON) injection 10 mg    Sig:   . amoxicillin (AMOXIL) 500 MG capsule    Sig: Take 2 capsules (1,000 mg total) by mouth 2 (two) times daily.    Dispense:  28 capsule    Refill:  0    Order Specific Question:  Supervising Provider    Answer:  Clementeen Graham, S [id number]  . HYDROcodone-acetaminophen (HYCET) 7.5-325 mg/15 ml solution    Sig: Take 10 mLs by mouth every 4 (four) hours as needed for moderate pain.    Dispense:  120 mL    Refill:  0    Order Specific Question:  Supervising Provider    Answer:  Clementeen Graham, Kathie Rhodes  [[id number]]       Graylon Good, PA-C 03/18/13 [phone]

## 2013-03-18 NOTE — ED Notes (Signed)
C/o body ache and sore throat which started this morning States she tried ibuprofen but no relief.

## 2013-03-20 NOTE — ED Provider Notes (Signed)
Medical screening examination/treatment/procedure(s) were performed by a resident physician or non-physician practitioner and as the supervising physician I was immediately available for consultation/collaboration.  Lindsie Simar, MD    Lorae Roig S Deangleo Passage, MD 03/20/13 0729 

## 2013-03-22 ENCOUNTER — Telehealth (HOSPITAL_COMMUNITY): Payer: Self-pay | Admitting: *Deleted

## 2013-03-22 LAB — URINE CULTURE

## 2013-03-22 MED ORDER — SULFAMETHOXAZOLE-TMP DS 800-160 MG PO TABS: 1.0000 | ORAL_TABLET | Freq: Two times a day (BID) | ORAL | Status: DC

## 2013-03-22 NOTE — ED Notes (Signed)
Pt. Notified that Dr. Lorenz CoasterKeller has e-prescribed the Rx. of Septra to her pharmacy. Vassie MoselleYork, Ollin Hochmuth M 03/22/2013

## 2013-03-22 NOTE — ED Notes (Addendum)
Urine culture: >100,000 colonies MRSA.  Pt. treated with Amoxicillin for strep throat.  Lab shown to Dr. Lorenz CoasterKeller.  He said he was going to treat her Septra.  I called pt. Pt. verified x 2 and given results.  Pt. told she needs Septra for MRSA in her urine culture. Pharmacy verified and told Dr. Lorenz CoasterKeller would e-prescribed it and to start it tonight.  Pt. instructed to follow-up with her PCP to have urine culture rechecked after she finishes her antibiotics or if worsening in any way. Pt. states her throat is much better, no fever, but still has back pain. I told her that could be from the UTI.  Pt. voiced understanding. Vassie MoselleYork, Diquan Kassis M 03/22/2013

## 2013-03-22 NOTE — ED Provider Notes (Signed)
Addendum: Her urine grew out MRSA. A prescription will be called into the her pharmacy, [facility] [address] for [employer]'s CorporationSeptra DS, #20, 1 twice a day for 10 days. It is suggested she have a repeat culture after antibiotics to confirm complete cure.  Reuben Likesavid C Noora Locascio, MD 03/22/13 [phone]

## 2013-04-24 ENCOUNTER — Other Ambulatory Visit: Payer: Self-pay | Admitting: Orthopedic Surgery

## 2013-04-24 DIAGNOSIS — M25561 Pain in right knee: Secondary | ICD-10-CM

## 2013-04-26 ENCOUNTER — Ambulatory Visit
Admission: RE | Admit: 2013-04-26 | Discharge: 2013-04-26 | Disposition: A | Discharge status: Home / Self Care | Payer: BC Managed Care – PPO | Source: Ambulatory Visit | Attending: Orthopedic Surgery | Admitting: Orthopedic Surgery

## 2013-04-26 DIAGNOSIS — M25561 Pain in right knee: Secondary | ICD-10-CM

## 2013-09-03 ENCOUNTER — Other Ambulatory Visit: Payer: Self-pay

## 2013-09-03 DIAGNOSIS — Z1231 Encounter for screening mammogram for malignant neoplasm of breast: Secondary | ICD-10-CM

## 2013-09-19 ENCOUNTER — Ambulatory Visit
Admission: RE | Admit: 2013-09-19 | Discharge: 2013-09-19 | Disposition: A | Discharge status: Home / Self Care | Payer: BC Managed Care – PPO | Source: Ambulatory Visit

## 2013-09-19 DIAGNOSIS — Z1231 Encounter for screening mammogram for malignant neoplasm of breast: Secondary | ICD-10-CM

## 2013-10-26 ENCOUNTER — Other Ambulatory Visit: Payer: Self-pay | Admitting: Obstetrics and Gynecology

## 2013-10-30 ENCOUNTER — Other Ambulatory Visit: Payer: Self-pay | Admitting: Obstetrics and Gynecology

## 2013-11-26 ENCOUNTER — Encounter (HOSPITAL_COMMUNITY): Payer: Self-pay | Admitting: Emergency Medicine

## 2013-11-26 ENCOUNTER — Telehealth: Payer: Self-pay | Admitting: Cardiovascular Disease

## 2013-11-26 NOTE — Telephone Encounter (Signed)
New Message      Pt calling c/o chest pain and heart attack symptoms. Please advise pt.

## 2013-11-26 NOTE — Telephone Encounter (Signed)
Spoke with patient and advised her of appointment opportunity with Dr. Elease HashimotoNahser for Thursday morning.  Patient states she would prefer to see Dr. Elease HashimotoNahser, so she will come in at 7:30 am 11/5.  I advised patient that if symptoms persist or worsen prior to appointment to go to [facility] for evaluation.  Patient verbalized understanding and agreement.

## 2013-11-26 NOTE — Telephone Encounter (Signed)
I could see her at 7:45 or 7:30 on Tursday if needed ( unless we already have someone in there)  or she can see Lawson FiscalLori or Boston ScientificScott. Otherwise, I'm not sure that I have any openings.

## 2013-11-26 NOTE — Telephone Encounter (Signed)
Received call directly from operator who states patient called to make an appointment with Dr. Elease HashimotoNahser; patient c/o sharp midsternal and left-sided chest pain episodes that radiate down left arm at rest and with activity Most recent episode Saturday night; state she was awakened at 0430 with sharp pain in her chest; denies s/s of acid reflux States had 2 episodes last week of same; c/o mild SOB with these episodes; denies n/v Patient states she did get dizzy and nauseated on [birth year]; states has not felt those symptoms since that time Patient reports she is no longer taking Propanolol or Maxzide Denies SVT, fast heart rate; states would like to be seen by Dr. Elease HashimotoNahser this week if possible. I advised patient that I will send information to Dr. Elease HashimotoNahser and call her back with his advice.  Patient verbalized understanding and agreement

## 2013-11-27 NOTE — Telephone Encounter (Signed)
When I spoke to patient yesterday, she was unable to travel to [place] today

## 2013-11-27 NOTE — Telephone Encounter (Signed)
I have openings in my  schedule today if she needs to be seen

## 2013-11-29 ENCOUNTER — Encounter: Payer: Self-pay | Admitting: Cardiovascular Disease

## 2013-11-29 ENCOUNTER — Ambulatory Visit (INDEPENDENT_AMBULATORY_CARE_PROVIDER_SITE_OTHER): Payer: BC Managed Care – PPO | Admitting: Cardiovascular Disease

## 2013-11-29 VITALS — BP 116/84 | HR 92 | Ht 65.5 in | Wt 265.0 lb

## 2013-11-29 DIAGNOSIS — I10 Essential (primary) hypertension: Secondary | ICD-10-CM

## 2013-11-29 DIAGNOSIS — R0789 Other chest pain: Secondary | ICD-10-CM

## 2013-11-29 NOTE — Assessment & Plan Note (Signed)
Shirley Morris presents for further episodes of chest discomfort. Some aspects of his chest discomfort are fairly typical-not associated with exertion. On the other hand, some aspects are somewhat worrisome for angina. The pain typically occurs but her left breast. This tends to radiate to her left arm.  Her EKG is nonacute.  We'll schedule her for a two-day [employer] myoview.  She needs to have a hysterectomy in 3 weeks and we will need this study in order to clear her for this surgery.  I will see her back in 2-3 months.

## 2013-11-29 NOTE — Progress Notes (Signed)
Shirley Morris Date of Birth  1970/03/31 [facility] [address]    [address] [place], [place]  [id number] [phone]  Fax  [phone]   Problem list: 1. Hypertension 2. History of obesity-status post gastric bypass 3. Chest pain 4. Supraventricular tachycardia    History of Present Illness:  Shirley Morris is a 43 year old female with a history of palpitations, hypertension, obesity-status post gastric bypass. She was recently seen for episodes of chest pain.  She's feeling a little bit better since she last saw Shirley FiscalLori. Her chest x-ray was unremarkable. Her d-dimer was normal. Her CPK-MB level and her troponin levels were normal.  She now has a runny nose and is also sneezing. She's getting more and more symptoms of an upper respiratory tract infection.  Nov. 5, 2015:  Pt has had 2 weeks of dizziness, nausea.  Also has had some left sided chest pain. Below her left breast. Awoke her from sleep one morning. Radiates down left arm.   Not related to change of position.  Not exertional,  Not related to eating or drinking.   Works as an Geneticist, molecularacademic coach.  Works [employer] [place] climbs stais at school,  Mild dyspnea but no chest discomfort.  She is scheduled for hysterectomy in 3 weeks.    Current Outpatient Prescriptions on File Prior to Visit  Medication Sig Dispense Refill  . calcium citrate (CALCITRATE - DOSED IN MG ELEMENTAL CALCIUM) 950 MG tablet Take 1 tablet by mouth daily.      . Cholecalciferol (VITAMIN D PO) Take 1 tablet by mouth daily.     Marland Kitchen. imipramine (TOFRANIL) 25 MG tablet Take 50 mg by mouth at bedtime.      . medroxyPROGESTERone (DEPO-PROVERA) 150 MG/ML injection Inject 150 mg into the muscle every 3 (three) months.    . Multiple Vitamin (MULTIVITAMIN) tablet Take 1 tablet by mouth daily.      Marland Kitchen. triamterene-hydrochlorothiazide (MAXZIDE-25) 37.5-25 MG per tablet Take 0.5 tablets by mouth daily.       No current  facility-administered medications on file prior to visit.    No Known Allergies  Past Medical History  Diagnosis Date  . SVT (supraventricular tachycardia)   . Hypertension   . Migraine   . Palpitations   . Chest pain     Negative stress echo in February of 2011  . Tear of meniscus of right knee   . Injury of thumb, right   . Cesarean delivery delivered     for fetal distress  . Family history of colon cancer   . Constipation     Past Surgical History  Procedure Laterality Date  . Knee arthroscopy  2011  . [place] echocardiography  11/26/2008    EF 55-60%  . 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 Cesarean section      History  Smoking status  . Never Smoker   Smokeless tobacco  . Never Used    History  Alcohol Use No    Family History  Problem Relation Age of Onset  . Coronary artery disease Father   . Diabetes Father   . Hypertension Father   . Colon cancer Maternal Uncle   . Colon cancer Cousin     First cousin maternal side     Reviw of Systems:  Reviewed in the HPI.  All other systems are negative.  Physical Exam: BP 116/84 mmHg  Pulse 92  Ht 5' 5.5" (1.664 m)  Wt 265  lb (120.203 kg)  BMI 43.41 kg/m2 The patient is alert and oriented x 3.  The mood and affect are normal.   Skin: warm and dry.  Color is normal.    HEENT:   Normocephalic, atraumatic. Her carotids are normal. There is no JVD. Neck is supple. Mucous membranes are moist.  Lungs: Her lungs are clear. She has no significant chest wall tenderness.   Heart: Heart regular rate S1-S2.    Abdomen: Abdominal exam is mildly obese. There is no hepatosplenomegaly.  Extremities:  No clubbing cyanosis or edema. The or cords.  Neuro:  Nonfocal. Her gait is normal.    ECG: 11/29/2013: Normal sinus rhythm with sinus arrhythmia. Her heart rate is 92. There are no ST or T wave changes. Assessment / Plan:   

## 2013-11-29 NOTE — Patient Instructions (Signed)
Your physician has requested that you have a lexiscan myoview. For further information please visit [email or web address]. Please follow instruction sheet, as given.  Your physician recommends that you continue on your current medications as directed. Please refer to the Current Medication list given to you today.  Your physician recommends that you schedule a follow-up appointment in: 2-3 months with Dr. Elease HashimotoNahser

## 2013-11-29 NOTE — Assessment & Plan Note (Signed)
BP is ok 

## 2013-12-04 ENCOUNTER — Telehealth (HOSPITAL_COMMUNITY): Payer: Self-pay

## 2013-12-04 NOTE — Telephone Encounter (Signed)
Encounter complete. 

## 2013-12-05 ENCOUNTER — Other Ambulatory Visit: Payer: Self-pay | Admitting: Obstetrics and Gynecology

## 2013-12-05 ENCOUNTER — Encounter (HOSPITAL_COMMUNITY): Payer: Self-pay

## 2013-12-05 ENCOUNTER — Encounter (HOSPITAL_COMMUNITY)
Admission: RE | Admit: 2013-12-05 | Discharge: 2013-12-05 | Disposition: A | Discharge status: Home / Self Care | Payer: BC Managed Care – PPO | Source: Ambulatory Visit | Attending: Obstetrics and Gynecology | Admitting: Obstetrics and Gynecology

## 2013-12-05 ENCOUNTER — Telehealth (HOSPITAL_COMMUNITY): Payer: Self-pay

## 2013-12-05 DIAGNOSIS — R42 Dizziness and giddiness: Secondary | ICD-10-CM | POA: Diagnosis present

## 2013-12-05 DIAGNOSIS — R0609 Other forms of dyspnea: Secondary | ICD-10-CM | POA: Diagnosis not present

## 2013-12-05 DIAGNOSIS — Z8249 Family history of ischemic heart disease and other diseases of the circulatory system: Secondary | ICD-10-CM | POA: Diagnosis not present

## 2013-12-05 DIAGNOSIS — R002 Palpitations: Secondary | ICD-10-CM | POA: Diagnosis not present

## 2013-12-05 DIAGNOSIS — Z6841 Body Mass Index (BMI) 40.0 and over, adult: Secondary | ICD-10-CM | POA: Diagnosis not present

## 2013-12-05 DIAGNOSIS — R079 Chest pain, unspecified: Secondary | ICD-10-CM | POA: Diagnosis not present

## 2013-12-05 DIAGNOSIS — R110 Nausea: Secondary | ICD-10-CM | POA: Diagnosis present

## 2013-12-05 HISTORY — DX: Anemia, unspecified: D64.9

## 2013-12-05 LAB — CBC
HEMATOCRIT: 40.5 % (ref 36.0–46.0)
HEMOGLOBIN: 13.4 g/dL (ref 12.0–15.0)
MCH: 30 pg (ref 26.0–34.0)
MCHC: 33.1 g/dL (ref 30.0–36.0)
MCV: 90.6 fL (ref 78.0–100.0)
Platelets: 324 10*3/uL (ref 150–400)
RBC: 4.47 MIL/uL (ref 3.87–5.11)
RDW: 14.1 % (ref 11.5–15.5)
WBC: 8.2 10*3/uL (ref 4.0–10.5)

## 2013-12-05 LAB — BASIC METABOLIC PANEL
ANION GAP: 11 (ref 5–15)
BUN: 8 mg/dL (ref 6–23)
CHLORIDE: 102 meq/L (ref 96–112)
CO2: 26 meq/L (ref 19–32)
CREATININE: 0.75 mg/dL (ref 0.50–1.10)
Calcium: 9.1 mg/dL (ref 8.4–10.5)
GFR calc Af Amer: >90 mL/min (ref >90)
GFR calc non Af Amer: >90 mL/min (ref >90)
GLUCOSE: 78 mg/dL (ref 70–99)
Potassium: 4 mEq/L (ref 3.7–5.3)
Sodium: 139 mEq/L (ref 137–147)

## 2013-12-05 NOTE — H&P (Signed)
Shirley Morris is a 43 y.o.  female P 1-0-0-1 for hysterectomy because of pelvic pain and may possibly proceed with bilateral salpingo-oophorectomy because of  persistent right ovarian ovarian cysts. For over six months the patient has had achy pelvic pain that occurred randomly initially but as the months progressed have  become for frequent.  She states that her left side is more painful than her right, is made worse with intercourse but denies any inter-menstrual bleeding, changes in bowel or bladder function or other related aggravating activities.  Her pain is rated as 8/10 on a 10 point pain scale but will decrease to 5/10 with over the counter NSAIDs.  Patient has been on Depo Provera for years and has therefore been amenorrheic.  A pelvic ultrasound in September 2015: anteverted uterus-7.01 x 4.20 x 2.82 cm, endometrium-18.06 mm; left ovary-2.35 x 1.26 x 1.18 cm and right ovary-5.09 x 4.53 x 3.76 cm containing #2 complex cysts: 3.79 x 4.13 x 3.64 cm with calcium along inferior wall and a smaller cyst 1.43 x 1.92 x 1.28 cm;  there was normal Dopple flow to each ovary.   An endometrial biopsy in October 2015 returned benign findings and a CA-125 was within normal range.  A review of both medical and surgical options were given to the patient regarding her symptoms and ultrasound findings however, she wants to proceed with hysterectomy with bilateral salpingectomy,  possible uni-lateral or bilateral oophorectomy.   Past Medical History  OB History: G: 1;  P: 1-0-0-1;  C-section 1996  GYN History: menarche: 43 YO    LMP: amenorrheic due to Depo Provera     The patient denies history of sexually transmitted disease.  Remote  history of abnormal PAP smear;  Last PAP smear-08/2013-normal  Medical History: Vitamin D Deficiency, Anemia, HSV-1, Malabsorption Syndrome, Osteopenia, Keloids,  Supra-ventricular Tachycardia, Hypertension, Migraine, Right Thumb Injury  Surgical History: 1995, 2011 and  2015 Right Knee Surgery;  2007 Gastric By-Pass;  2010 Ovarian Cystectomy;    2015 Removal of Keloid Denies problems with anesthesia or history of blood transfusions  Family History: Heart Disease, Diabetes Mellitus,  Hypertension and Colon Cancer  Social History: Married and employed as an Geneticist, molecularAcademic Coach;  Denies tobacco or alcohol use   Medications:  Depo Provera 150 mg  IM every 12 weeks Imipramine 25 mg daily Meloxicam 15 mg with food daily prn Triamterene/HCTZ  37.5/25 mg  daily Vitamin D daily Calcium daily B-12 daily Iron daily  Denies sensitivity to peanuts, shellfish, soy, latex or adhesives.   No Known Allergies   ROS: Admits to glasses, right knee and ankle swelling,  chest pain (cardiac evaluation 12/06/2013);  denies headache, vision changes, nasal congestion, dysphagia, tinnitus, dizziness, hoarseness, cough,  shortness of breath, nausea, vomiting, diarrhea,constipation,  urinary frequency, urgency  dysuria, hematuria, vaginitis symptoms, pelvic pain, swelling of joints,easy bruising,  myalgias, arthralgias, skin rashes, unexplained weight loss and except as is mentioned in the history of present illness, patient's review of systems is otherwise negative.   Physical Exam  Bp: 120/70    P: 92    R: 18   Temperature: 99 degrees F orally     Weight: 267 lbs.  Height: 5\' 5"    BMI: 44.4  Neck: supple without masses or thyromegaly Lungs: clear to auscultation Heart: regular rate and rhythm Abdomen: soft, non-tender and no organomegaly Pelvic:EGBUS- wnl; vagina-normal rugae; uterus-normal size mildly tender (exam limited by habitus)  cervix without lesions or motion tenderness; adnexae-mild bilateral  tenderness  but no  masses Extremities:  no clubbing, cyanosis or edema   Assesment: Chronic Pelvic Pain            Complex Right Ovarian Cysts   Disposition:  A discussion was held with patient regarding the indication for her procedure(s) along with the risks, which  include but are not limited to: reaction to anesthesia, damage to adjacent organs, infection and excessive bleeding. The patient verbalized understanding of these risks and has consented to proceed with a Laparoscopically Assisted Vaginal Hysterectomy, Bilateral Salpingectomy, Unilateral Oophorectomy, Possible  Bilateral Oophorectomy and Possible Total Abdominal [facility]ste[facility] on  November 23, /2015 at 10 a.m.   CSN# [id number]   Shirley Hawbaker J. Lowell GuitarPowell, PA-C  for Dr. Maris BergerVanessa P. Morris

## 2013-12-05 NOTE — Telephone Encounter (Signed)
Encounter complete. 

## 2013-12-05 NOTE — Patient Instructions (Addendum)
Your procedure is scheduled on:12/17/13  Enter through the Main Entrance at :0830 am Pick up desk phone and dial [phone] and inform us of your arrival.  Please call [phone] if you have any problems the morning of surgery.  Remember: Do not eat food or drink liquids, including water, after midnight: Sunday    You may brush your teeth the morning of surgery.  Take these meds the morning of surgery with a sip of water: BP med  DO NOT wear jewelry, eye make-up, lipstick,body lotion, or dark fingernail polish.  (Polished toes are ok) You may wear deodorant.  If you are to be admitted after surgery, leave suitcase in car until your room has been assigned. Patients discharged on the day of surgery will not be allowed to drive home. Wear loose fitting, comfortable clothes for your ride home.

## 2013-12-06 ENCOUNTER — Ambulatory Visit (HOSPITAL_COMMUNITY)
Admission: RE | Admit: 2013-12-06 | Discharge: 2013-12-06 | Disposition: A | Discharge status: Home / Self Care | Payer: BC Managed Care – PPO | Source: Ambulatory Visit | Attending: Cardiology | Admitting: Cardiology

## 2013-12-06 DIAGNOSIS — R42 Dizziness and giddiness: Secondary | ICD-10-CM | POA: Insufficient documentation

## 2013-12-06 DIAGNOSIS — R079 Chest pain, unspecified: Secondary | ICD-10-CM | POA: Diagnosis not present

## 2013-12-06 DIAGNOSIS — R110 Nausea: Secondary | ICD-10-CM | POA: Insufficient documentation

## 2013-12-06 DIAGNOSIS — R0789 Other chest pain: Secondary | ICD-10-CM

## 2013-12-06 DIAGNOSIS — R0609 Other forms of dyspnea: Secondary | ICD-10-CM | POA: Insufficient documentation

## 2013-12-06 DIAGNOSIS — Z6841 Body Mass Index (BMI) 40.0 and over, adult: Secondary | ICD-10-CM | POA: Insufficient documentation

## 2013-12-06 DIAGNOSIS — Z8249 Family history of ischemic heart disease and other diseases of the circulatory system: Secondary | ICD-10-CM | POA: Insufficient documentation

## 2013-12-06 DIAGNOSIS — R002 Palpitations: Secondary | ICD-10-CM | POA: Insufficient documentation

## 2013-12-06 MED ORDER — TECHNETIUM TC 99M SESTAMIBI GENERIC - CARDIOLITE
30.5000 | Freq: Once | INTRAVENOUS | Status: AC | PRN
  Administered 2013-12-06: 31 via INTRAVENOUS

## 2013-12-06 MED ORDER — REGADENOSON 0.4 MG/5ML IV SOLN
0.4000 mg | Freq: Once | INTRAVENOUS | Status: AC
  Administered 2013-12-06: 0.4 mg via INTRAVENOUS

## 2013-12-06 MED ORDER — AMINOPHYLLINE 25 MG/ML IV SOLN
75.0000 mg | Freq: Once | INTRAVENOUS | Status: AC
  Administered 2013-12-06: 75 mg via INTRAVENOUS

## 2013-12-06 NOTE — Procedures (Addendum)
[place] [facility] [address] [place] [address] [phone]  Cardiology Nuclear Med Study  Shirley AbbeLakesha R Morris is a 43 y.o. female     MRN : [id number]     DOB: 1970/11/02  Procedure Date: 12/06/2013  Nuclear Med Background Indication for Stress Test:  Surgical Clearance History:  SVT;No prior respiratory history reported;No prior NUC MPI for comparison. Cardiac Risk Factors: Family History - CAD, Hypertension and Obesity  Symptoms:  Chest Pain, Dizziness, DOE, Nausea and Palpitations   Nuclear Pre-Procedure Caffeine/Decaff Intake:  7:00pm NPO After: 5:00am   IV Site: R Forearm  IV 0.9% NS with Angio Cath:  22g  Chest Size (in):  n/a IV Started by: Berdie OgrenAmanda Wease, RN  Height: 5' 5.5" (1.664 m)  Cup Size: DDD  BMI:  Body mass index is 43.41 kg/(m^2). Weight:  265 lb (120.203 kg)   Tech Comments:  n/a    Nuclear Med Study 1 or 2 day study: 2 day  Stress Test Type:  Lexiscan  Order Authorizing Provider:  Laqueta CarinaPhilip Nasher , MD   Resting Radionuclide: Technetium 573m Sestamibi  Resting Radionuclide Dose: 30.8 mCi   Stress Radionuclide:  Technetium 253m Sestamibi  Stress Radionuclide Dose: 30.5 mCi           Stress Protocol Rest HR: 78 Stress HR: 106  Rest BP: 116/83 Stress BP: 146/75  Exercise Time (min): n/a METS: n/a   Predicted Max HR: 177 bpm % Max HR: 59.89 bpm Rate Pressure Product: [id number]  Dose of Adenosine (mg):  n/a Dose of Lexiscan: 0.4 mg  Dose of Atropine (mg): n/a Dose of Dobutamine: n/a mcg/kg/min (at max HR)  Stress Test Technologist: Esperanza Sheetserry-Marie Martin, CCT Nuclear Technologist: Gonzella LexPam Phillips, CNMT   Rest Procedure:  Myocardial perfusion imaging was performed at rest 45 minutes following the intravenous administration of Technetium 783m Sestamibi. Stress Procedure:  The patient received IV Lexiscan 0.4 mg over 15-seconds.  Technetium 783m Sestamibi injected IV at 30-seconds.  Patient experienced SOB and  Headache and 75 mg Aminophylline IV was administered.  There were no significant changes with Lexiscan.  Quantitative spect images were obtained after a 45 minute delay.  Transient Ischemic Dilatation (Normal <1.22):  1.08  QGS EDV:  92 ml QGS ESV:  43 ml LV Ejection Fraction: 53%       Rest ECG: NSR - Normal EKG  Stress ECG: No significant ST segment change suggestive of ischemia.  QPS Raw Data Images:  Acquisition technically good; normal left ventricular size. Stress Images:  There is decreased uptake in the anterior wall. Rest Images:  There is decreased uptake in the anterior wall, less prominent compared to the stress images. Subtraction (SDS):  These findings are consistent with mild anterior ischemia; defect may also be related to shifting breast attenuation.  Impression Exercise Capacity:  Lexiscan with no exercise. BP Response:  Normal blood pressure response. Clinical Symptoms:  There is dyspnea. ECG Impression:  No significant ST segment change suggestive of ischemia. Comparison with Prior Nuclear Study: No images to compare  Overall Impression:  Low risk stress nuclear study with a small, mild intensity, partially reversible anterior defect consistent with soft tissue attenuation and mild anterior ischemia; defect may also be related to shifting breast attenuation.  LV Wall Motion:  NL LV Function; NL Wall Motion   Olga MillersBrian Crenshaw, MD  12/07/2013 9:55 AM

## 2013-12-07 ENCOUNTER — Ambulatory Visit (HOSPITAL_COMMUNITY)
Admission: RE | Admit: 2013-12-07 | Discharge: 2013-12-07 | Disposition: A | Discharge status: Home / Self Care | Payer: BC Managed Care – PPO | Source: Ambulatory Visit | Attending: Internal Medicine | Admitting: Internal Medicine

## 2013-12-07 ENCOUNTER — Telehealth: Payer: Self-pay | Admitting: Nurse Practitioner

## 2013-12-07 ENCOUNTER — Encounter: Payer: Self-pay | Admitting: Nurse Practitioner

## 2013-12-07 DIAGNOSIS — R0789 Other chest pain: Secondary | ICD-10-CM

## 2013-12-07 DIAGNOSIS — Z01818 Encounter for other preprocedural examination: Secondary | ICD-10-CM | POA: Insufficient documentation

## 2013-12-07 DIAGNOSIS — R9439 Abnormal result of other cardiovascular function study: Secondary | ICD-10-CM

## 2013-12-07 MED ORDER — TECHNETIUM TC 99M SESTAMIBI GENERIC - CARDIOLITE
30.8000 | Freq: Once | INTRAVENOUS | Status: AC | PRN
  Administered 2013-12-07: 30.8 via INTRAVENOUS

## 2013-12-07 MED ORDER — ASPIRIN EC 81 MG PO TBEC: 81.0000 mg | DELAYED_RELEASE_TABLET | Freq: Every day | ORAL | Status: DC

## 2013-12-07 MED ORDER — METOPROLOL SUCCINATE ER 25 MG PO TB24: 25.0000 mg | ORAL_TABLET | Freq: Every day | ORAL | Status: DC

## 2013-12-07 NOTE — Telephone Encounter (Signed)
Dr. Elease HashimotoNahser called and spoke with patient and explained need for cardiac catheterization.  I scheduled patient for Tuesday 11/17 with Dr. SwazilandJordan per patient request and reviewed her pre-cath instructions over the telephone.  Patient is aware of need for lab appointment on 11/16 for a pt/inr and new medications of Toprol XL 25 mg once daily and Aspirin 81 mg once daily.    Patient verbalized understanding and agreement with plan of care and is aware that she can call Monday with questions or concerns.

## 2013-12-10 ENCOUNTER — Other Ambulatory Visit (INDEPENDENT_AMBULATORY_CARE_PROVIDER_SITE_OTHER): Payer: BC Managed Care – PPO | Admitting: *Deleted

## 2013-12-10 DIAGNOSIS — R0789 Other chest pain: Secondary | ICD-10-CM

## 2013-12-10 DIAGNOSIS — R9439 Abnormal result of other cardiovascular function study: Secondary | ICD-10-CM

## 2013-12-10 LAB — PROTIME-INR
INR: 1.1 ratio — AB (ref 0.8–1.0)
PROTHROMBIN TIME: 12.5 s (ref 9.6–13.1)

## 2013-12-10 MED ORDER — DEXTROSE 5 % IV SOLN
2.0000 g | INTRAVENOUS | Status: DC
  Filled 2013-12-10: qty 2

## 2013-12-11 ENCOUNTER — Encounter (HOSPITAL_COMMUNITY)
Admission: RE | Disposition: A | Discharge status: Home / Self Care | Payer: Self-pay | Source: Ambulatory Visit | Attending: Cardiology

## 2013-12-11 ENCOUNTER — Ambulatory Visit (HOSPITAL_COMMUNITY)
Admission: RE | Admit: 2013-12-11 | Discharge: 2013-12-11 | Disposition: A | Discharge status: Home / Self Care | Payer: BC Managed Care – PPO | Source: Ambulatory Visit | Attending: Cardiology | Admitting: Cardiology

## 2013-12-11 DIAGNOSIS — I471 Supraventricular tachycardia: Secondary | ICD-10-CM | POA: Insufficient documentation

## 2013-12-11 DIAGNOSIS — E669 Obesity, unspecified: Secondary | ICD-10-CM | POA: Insufficient documentation

## 2013-12-11 DIAGNOSIS — G43909 Migraine, unspecified, not intractable, without status migrainosus: Secondary | ICD-10-CM | POA: Diagnosis not present

## 2013-12-11 DIAGNOSIS — R0789 Other chest pain: Secondary | ICD-10-CM | POA: Diagnosis not present

## 2013-12-11 DIAGNOSIS — R9439 Abnormal result of other cardiovascular function study: Secondary | ICD-10-CM | POA: Diagnosis present

## 2013-12-11 DIAGNOSIS — Z6841 Body Mass Index (BMI) 40.0 and over, adult: Secondary | ICD-10-CM | POA: Diagnosis not present

## 2013-12-11 DIAGNOSIS — R079 Chest pain, unspecified: Secondary | ICD-10-CM | POA: Diagnosis not present

## 2013-12-11 DIAGNOSIS — I10 Essential (primary) hypertension: Secondary | ICD-10-CM | POA: Diagnosis not present

## 2013-12-11 HISTORY — PX: LEFT HEART CATHETERIZATION WITH CORONARY ANGIOGRAM: SHX5451

## 2013-12-11 LAB — PREGNANCY, URINE: PREG TEST UR: NEGATIVE

## 2013-12-11 SURGERY — LEFT HEART CATHETERIZATION WITH CORONARY ANGIOGRAM
Anesthesia: LOCAL

## 2013-12-11 MED ORDER — FENTANYL CITRATE 0.05 MG/ML IJ SOLN
INTRAMUSCULAR | Status: AC
  Filled 2013-12-11: qty 2

## 2013-12-11 MED ORDER — VERAPAMIL HCL 2.5 MG/ML IV SOLN
INTRAVENOUS | Status: AC
  Filled 2013-12-11: qty 2

## 2013-12-11 MED ORDER — MIDAZOLAM HCL 2 MG/2ML IJ SOLN
INTRAMUSCULAR | Status: AC
  Filled 2013-12-11: qty 2

## 2013-12-11 MED ORDER — SODIUM CHLORIDE 0.9 % IJ SOLN: 3.0000 mL | Freq: Two times a day (BID) | INTRAMUSCULAR | Status: DC

## 2013-12-11 MED ORDER — HEPARIN (PORCINE) IN NACL 2-0.9 UNIT/ML-% IJ SOLN
INTRAMUSCULAR | Status: AC
  Filled 2013-12-11: qty 1000

## 2013-12-11 MED ORDER — NITROGLYCERIN 1 MG/10 ML FOR IR/CATH LAB
INTRA_ARTERIAL | Status: AC
  Filled 2013-12-11: qty 10

## 2013-12-11 MED ORDER — SODIUM CHLORIDE 0.9 % IV SOLN
INTRAVENOUS | Status: DC
  Administered 2013-12-11: 12:00:00 via INTRAVENOUS

## 2013-12-11 MED ORDER — SODIUM CHLORIDE 0.9 % IJ SOLN: 3.0000 mL | INTRAMUSCULAR | Status: DC | PRN

## 2013-12-11 MED ORDER — ASPIRIN 81 MG PO CHEW: 81.0000 mg | CHEWABLE_TABLET | ORAL | Status: DC

## 2013-12-11 MED ORDER — HEPARIN SODIUM (PORCINE) 1000 UNIT/ML IJ SOLN
INTRAMUSCULAR | Status: AC
  Filled 2013-12-11: qty 1

## 2013-12-11 MED ORDER — SODIUM CHLORIDE 0.9 % IV SOLN: 250.0000 mL | INTRAVENOUS | Status: DC | PRN

## 2013-12-11 MED ORDER — LIDOCAINE HCL (PF) 1 % IJ SOLN
INTRAMUSCULAR | Status: AC
  Filled 2013-12-11: qty 30

## 2013-12-11 NOTE — Interval H&P Note (Signed)
History and Physical Interval Note:  12/11/2013 2:06 PM  Shirley Morris  has presented today for surgery, with the diagnosis of abnormal myoview, cp  The various methods of treatment have been discussed with the patient and family. After consideration of risks, benefits and other options for treatment, the patient has consented to  Procedure(s): LEFT HEART CATHETERIZATION WITH CORONARY ANGIOGRAM (N/A) as a surgical intervention .  The patient's history has been reviewed, patient examined, no change in status, stable for surgery.  I have reviewed the patient's chart and labs.  Questions were answered to the patient's satisfaction.   Cath Lab Visit (complete for each Cath Lab visit)  Clinical Evaluation Leading to the Procedure:   ACS: No.  Non-ACS:    Anginal Classification: CCS II  Anti-ischemic medical therapy: No Therapy  Non-Invasive Test Results: Low-risk stress test findings: cardiac mortality <1%/year  Prior CABG: No previous CABG        Theron Aristaeter G[facility] CenterJordanMD,FACC 12/11/2013 2:06 PM

## 2013-12-11 NOTE — H&P (View-Only) (Signed)
Shirley Morris Date of Birth  1970/03/31 [facility] [address]    [address] [place], [place]  [address] [phone]  Fax  [phone]   Problem list: 1. Hypertension 2. History of obesity-status post gastric bypass 3. Chest pain 4. Supraventricular tachycardia    History of Present Illness:  Shirley Morris is a 43 year old female with a history of palpitations, hypertension, obesity-status post gastric bypass. She was recently seen for episodes of chest pain.  She's feeling a little bit better since she last saw Shirley FiscalLori. Her chest x-ray was unremarkable. Her d-dimer was normal. Her CPK-MB level and her troponin levels were normal.  She now has a runny nose and is also sneezing. She's getting more and more symptoms of an upper respiratory tract infection.  Nov. 5, 2015:  Pt has had 2 weeks of dizziness, nausea.  Also has had some left sided chest pain. Below her left breast. Awoke her from sleep one morning. Radiates down left arm.   Not related to change of position.  Not exertional,  Not related to eating or drinking.   Works as an Geneticist, molecularacademic coach.  Works [employer] [place] - climbs stais at school,  Mild dyspnea but no chest discomfort.  She is scheduled for hysterectomy in 3 weeks.    Current Outpatient Prescriptions on File Prior to Visit  Medication Sig Dispense Refill  . calcium citrate (CALCITRATE - DOSED IN MG ELEMENTAL CALCIUM) 950 MG tablet Take 1 tablet by mouth daily.      . Cholecalciferol (VITAMIN D PO) Take 1 tablet by mouth daily.     Marland Kitchen. imipramine (TOFRANIL) 25 MG tablet Take 50 mg by mouth at bedtime.      . medroxyPROGESTERone (DEPO-PROVERA) 150 MG/ML injection Inject 150 mg into the muscle every 3 (three) months.    . Multiple Vitamin (MULTIVITAMIN) tablet Take 1 tablet by mouth daily.      Marland Kitchen. triamterene-hydrochlorothiazide (MAXZIDE-25) 37.5-25 MG per tablet Take 0.5 tablets by mouth daily.       No current  facility-administered medications on file prior to visit.    No Known Allergies  Past Medical History  Diagnosis Date  . SVT (supraventricular tachycardia)   . Hypertension   . Migraine   . Palpitations   . Chest pain     Negative stress echo in February of 2011  . Tear of meniscus of right knee   . Injury of thumb, right   . Cesarean delivery delivered     for fetal distress  . Family history of colon cancer   . Constipation     Past Surgical History  Procedure Laterality Date  . Knee arthroscopy  2011  . [place] echocardiography  11/26/2008    EF 55-60%  . 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 Cesarean section      History  Smoking status  . Never Smoker   Smokeless tobacco  . Never Used    History  Alcohol Use No    Family History  Problem Relation Age of Onset  . Coronary artery disease Father   . Diabetes Father   . Hypertension Father   . Colon cancer Maternal Uncle   . Colon cancer Cousin     First cousin maternal side     Reviw of Systems:  Reviewed in the HPI.  All other systems are negative.  Physical Exam: BP 116/84 mmHg  Pulse 92  Ht 5' 5.5" (1.664 m)  Wt 265  lb (120.203 kg)  BMI 43.41 kg/m2 The patient is alert and oriented x 3.  The mood and affect are normal.   Skin: warm and dry.  Color is normal.    HEENT:   Normocephalic, atraumatic. Her carotids are normal. There is no JVD. Neck is supple. Mucous membranes are moist.  Lungs: Her lungs are clear. She has no significant chest wall tenderness.   Heart: Heart regular rate S1-S2.    Abdomen: Abdominal exam is mildly obese. There is no hepatosplenomegaly.  Extremities:  No clubbing cyanosis or edema. The or cords.  Neuro:  Nonfocal. Her gait is normal.    ECG: 11/29/2013: Normal sinus rhythm with sinus arrhythmia. Her heart rate is 92. There are no ST or T wave changes. Assessment / Plan:

## 2013-12-11 NOTE — Discharge Instructions (Signed)
Radial Site Care °Refer to this sheet in the next few weeks. These instructions provide you with information on caring for yourself after your procedure. Your caregiver may also give you more specific instructions. Your treatment has been planned according to current medical practices, but problems sometimes occur. Call your caregiver if you have any problems or questions after your procedure. °HOME CARE INSTRUCTIONS °· You may shower the day after the procedure. Remove the bandage (dressing) and gently wash the site with plain soap and water. Gently pat the site dry. °· Do not apply powder or lotion to the site. °· Do not submerge the affected site in water for 3 to 5 days. °· Inspect the site at least twice daily. °· Do not flex or bend the affected arm for 24 hours. °· No lifting over 5 pounds (2.3 kg) for 5 days after your procedure. °· Do not drive home if you are discharged the same day of the procedure. Have someone else drive you. °· You may drive 24 hours after the procedure unless otherwise instructed by your caregiver. °· Do not operate machinery or power tools for 24 hours. °· A responsible adult should be with you for the first 24 hours after you arrive home. °What to expect: °· Any bruising will usually fade within 1 to 2 weeks. °· Blood that collects in the tissue (hematoma) may be painful to the touch. It should usually decrease in size and tenderness within 1 to 2 weeks. °SEEK IMMEDIATE MEDICAL CARE IF: °· You have unusual pain at the radial site. °· You have redness, warmth, swelling, or pain at the radial site. °· You have drainage (other than a small amount of blood on the dressing). °· You have chills. °· You have a fever or persistent symptoms for more than 72 hours. °· You have a fever and your symptoms suddenly get worse. °· Your arm becomes pale, cool, tingly, or numb. °· You have heavy bleeding from the site. Hold pressure on the site. °Document Released: 02/13/2010 Document Revised:  04/05/2011 Document Reviewed: 02/13/2010 °ExitCare® Patient Information ©2015 [employer]. This information is not intended to replace advice given to you by your health care provider. Make sure you discuss any questions you have with your health care provider. ° °

## 2013-12-11 NOTE — CV Procedure (Signed)
    Cardiac Catheterization Procedure Note  Name: Shirley Morris MRN: [id number] DOB: 09/18/1970  Procedure: Left Heart Cath, Selective Coronary Angiography, LV angiography  Indication: 43 yo BF with atypical chest pain and abnormal myoview study.   Procedural Details: The right wrist was prepped, draped, and anesthetized with 1% lidocaine. Using the modified Seldinger technique, a 6 French slender sheath was introduced into the right radial artery. 3 mg of verapamil was administered through the sheath, weight-based unfractionated heparin was administered intravenously. Standard Judkins catheters were used for selective coronary angiography and left ventriculography. Catheter exchanges were performed over an exchange length guidewire. There were no immediate procedural complications. A TR band was used for radial hemostasis at the completion of the procedure.  The patient was transferred to the post catheterization recovery area for further monitoring.  Procedural Findings: Hemodynamics: AO 116/75 mean 95 mm Hg LV 117/10 mm Hg  Coronary angiography: Coronary dominance: right  Left mainstem: Normal  Left anterior descending (LAD): Normal  Left circumflex (LCx): Normal  Right coronary artery (RCA): Normal  Left ventriculography: Left ventricular systolic function is normal, LVEF is estimated at 55-65%, there is no significant mitral regurgitation   Final Conclusions:   1. Normal coronary anatomy 2. Normal LV function.  Recommendations: risk factor modification. She is cleared from a cardiac standpoint for GYN surgery.  Peter SwazilandJordan, MDFACC  12/11/2013, 2:48 PM

## 2013-12-12 ENCOUNTER — Telehealth: Payer: Self-pay | Admitting: Cardiovascular Disease

## 2013-12-12 NOTE — Telephone Encounter (Signed)
Spoke with patient who states she is doing well and is aware that Dr. SwazilandJordan gave her cardiac clearance for her upcoming GYN surgery.  Patient has questions about billing which I have answered to the best of my ability.

## 2013-12-12 NOTE — Telephone Encounter (Signed)
New message    Patient calling back to speak with nurse has questions -upcoming surgery.

## 2013-12-12 NOTE — Telephone Encounter (Signed)
Patient also questioned follow-up with Dr. Elease HashimotoNahser which is scheduled for February.  I advised patient that it would be fine to keep this f/u and to call sooner if she experiences symptoms that are concerning or has other concerns or questions.  Patient verbalized understanding and agreement.

## 2013-12-16 DIAGNOSIS — N83201 Unspecified ovarian cyst, right side: Secondary | ICD-10-CM | POA: Diagnosis present

## 2013-12-17 ENCOUNTER — Ambulatory Visit (HOSPITAL_COMMUNITY): Payer: BC Managed Care – PPO | Admitting: Anesthesiology

## 2013-12-17 ENCOUNTER — Encounter (HOSPITAL_COMMUNITY)
Admission: RE | Disposition: A | Discharge status: Home / Self Care | Payer: Self-pay | Source: Ambulatory Visit | Attending: Obstetrics and Gynecology

## 2013-12-17 ENCOUNTER — Inpatient Hospital Stay (HOSPITAL_COMMUNITY)
Admission: RE | Admit: 2013-12-17 | Discharge: 2013-12-19 | DRG: 743 | Disposition: A | Discharge status: Home / Self Care | Payer: BC Managed Care – PPO | Source: Ambulatory Visit | Attending: Obstetrics and Gynecology | Admitting: Obstetrics and Gynecology

## 2013-12-17 ENCOUNTER — Encounter (HOSPITAL_COMMUNITY): Payer: Self-pay | Admitting: Anesthesiology

## 2013-12-17 DIAGNOSIS — R102 Pelvic and perineal pain: Secondary | ICD-10-CM | POA: Diagnosis present

## 2013-12-17 DIAGNOSIS — L910 Hypertrophic scar: Secondary | ICD-10-CM | POA: Diagnosis present

## 2013-12-17 DIAGNOSIS — D62 Acute posthemorrhagic anemia: Secondary | ICD-10-CM | POA: Diagnosis not present

## 2013-12-17 DIAGNOSIS — N83201 Unspecified ovarian cyst, right side: Secondary | ICD-10-CM

## 2013-12-17 DIAGNOSIS — N8320 Unspecified ovarian cysts: Principal | ICD-10-CM | POA: Diagnosis present

## 2013-12-17 DIAGNOSIS — Z9884 Bariatric surgery status: Secondary | ICD-10-CM

## 2013-12-17 DIAGNOSIS — Z6841 Body Mass Index (BMI) 40.0 and over, adult: Secondary | ICD-10-CM

## 2013-12-17 DIAGNOSIS — N83209 Unspecified ovarian cyst, unspecified side: Secondary | ICD-10-CM | POA: Diagnosis present

## 2013-12-17 HISTORY — PX: LAPAROSCOPIC ASSISTED VAGINAL HYSTERECTOMY: SHX5398

## 2013-12-17 LAB — PREGNANCY, URINE: PREG TEST UR: NEGATIVE

## 2013-12-17 SURGERY — HYSTERECTOMY, VAGINAL, LAPAROSCOPY-ASSISTED
Anesthesia: General | Site: Abdomen | Laterality: Right

## 2013-12-17 MED ORDER — LIDOCAINE HCL (CARDIAC) 20 MG/ML IV SOLN
INTRAVENOUS | Status: AC
  Filled 2013-12-17: qty 5

## 2013-12-17 MED ORDER — ROCURONIUM BROMIDE 100 MG/10ML IV SOLN
INTRAVENOUS | Status: AC
  Filled 2013-12-17: qty 1

## 2013-12-17 MED ORDER — SODIUM CHLORIDE 0.9 % IJ SOLN
INTRAMUSCULAR | Status: AC
  Filled 2013-12-17: qty 50

## 2013-12-17 MED ORDER — HYDROMORPHONE HCL 1 MG/ML IJ SOLN
INTRAMUSCULAR | Status: AC
  Filled 2013-12-17: qty 1

## 2013-12-17 MED ORDER — VASOPRESSIN 20 UNIT/ML IV SOLN
INTRAVENOUS | Status: DC | PRN
  Administered 2013-12-17: 20 mL via INTRAMUSCULAR

## 2013-12-17 MED ORDER — SCOPOLAMINE 1 MG/3DAYS TD PT72
MEDICATED_PATCH | TRANSDERMAL | Status: AC
  Administered 2013-12-17: 1.5 mg via TRANSDERMAL
  Filled 2013-12-17: qty 1

## 2013-12-17 MED ORDER — DEXTROSE 5 % IV SOLN: 2.0000 g | Freq: Two times a day (BID) | INTRAVENOUS | Status: DC

## 2013-12-17 MED ORDER — TRIAMCINOLONE ACETONIDE 40 MG/ML IJ SUSP
INTRAMUSCULAR | Status: DC | PRN
  Administered 2013-12-17: 40 mg via INTRAMUSCULAR

## 2013-12-17 MED ORDER — DEXTROSE 5 % IV SOLN
2.0000 g | Freq: Once | INTRAVENOUS | Status: AC
  Administered 2013-12-17: 2 g via INTRAVENOUS
  Filled 2013-12-17: qty 2

## 2013-12-17 MED ORDER — FENTANYL CITRATE 0.05 MG/ML IJ SOLN
INTRAMUSCULAR | Status: AC
  Filled 2013-12-17: qty 2

## 2013-12-17 MED ORDER — ONDANSETRON HCL 4 MG/2ML IJ SOLN
INTRAMUSCULAR | Status: DC | PRN
  Administered 2013-12-17: 4 mg via INTRAVENOUS

## 2013-12-17 MED ORDER — KETOROLAC TROMETHAMINE 30 MG/ML IJ SOLN
30.0000 mg | Freq: Four times a day (QID) | INTRAMUSCULAR | Status: DC
  Administered 2013-12-17 – 2013-12-18 (×2): 30 mg via INTRAVENOUS
  Filled 2013-12-17 (×3): qty 1

## 2013-12-17 MED ORDER — SCOPOLAMINE 1 MG/3DAYS TD PT72
1.0000 | MEDICATED_PATCH | Freq: Once | TRANSDERMAL | Status: DC
  Administered 2013-12-17: 1.5 mg via TRANSDERMAL

## 2013-12-17 MED ORDER — MENTHOL 3 MG MT LOZG
1.0000 | LOZENGE | OROMUCOSAL | Status: DC | PRN
  Administered 2013-12-18: 3 mg via ORAL
  Filled 2013-12-17: qty 9

## 2013-12-17 MED ORDER — HYDROMORPHONE 0.3 MG/ML IV SOLN
INTRAVENOUS | Status: DC
  Administered 2013-12-17: 0.9 mg via INTRAVENOUS
  Administered 2013-12-17: 19:00:00 via INTRAVENOUS
  Filled 2013-12-17: qty 25

## 2013-12-17 MED ORDER — CEFAZOLIN SODIUM-DEXTROSE 2-3 GM-% IV SOLR
INTRAVENOUS | Status: AC
  Filled 2013-12-17: qty 50

## 2013-12-17 MED ORDER — METOCLOPRAMIDE HCL 5 MG/ML IJ SOLN: 10.0000 mg | Freq: Once | INTRAMUSCULAR | Status: DC | PRN

## 2013-12-17 MED ORDER — MIDAZOLAM HCL 2 MG/2ML IJ SOLN
INTRAMUSCULAR | Status: DC | PRN
  Administered 2013-12-17: 2 mg via INTRAVENOUS

## 2013-12-17 MED ORDER — IMIPRAMINE HCL 50 MG PO TABS
75.0000 mg | ORAL_TABLET | Freq: Every day | ORAL | Status: DC
  Administered 2013-12-17 – 2013-12-18 (×2): 75 mg via ORAL
  Filled 2013-12-17 (×2): qty 1

## 2013-12-17 MED ORDER — BUPIVACAINE HCL (PF) 0.25 % IJ SOLN
INTRAMUSCULAR | Status: AC
  Filled 2013-12-17: qty 30

## 2013-12-17 MED ORDER — FERROUS SULFATE 325 (65 FE) MG PO TABS
325.0000 mg | ORAL_TABLET | Freq: Two times a day (BID) | ORAL | Status: DC
  Administered 2013-12-18 – 2013-12-19 (×3): 325 mg via ORAL
  Filled 2013-12-17 (×3): qty 1

## 2013-12-17 MED ORDER — ESTRADIOL 0.1 MG/GM VA CREA
TOPICAL_CREAM | VAGINAL | Status: AC
  Filled 2013-12-17: qty 42.5

## 2013-12-17 MED ORDER — DEXAMETHASONE SODIUM PHOSPHATE 10 MG/ML IJ SOLN
INTRAMUSCULAR | Status: DC | PRN
  Administered 2013-12-17: 4 mg via INTRAVENOUS

## 2013-12-17 MED ORDER — NALOXONE HCL 0.4 MG/ML IJ SOLN: 0.4000 mg | INTRAMUSCULAR | Status: DC | PRN

## 2013-12-17 MED ORDER — MEPERIDINE HCL 25 MG/ML IJ SOLN: 6.2500 mg | INTRAMUSCULAR | Status: DC | PRN

## 2013-12-17 MED ORDER — PROPOFOL 10 MG/ML IV EMUL
INTRAVENOUS | Status: AC
  Filled 2013-12-17: qty 40

## 2013-12-17 MED ORDER — DOCUSATE SODIUM 100 MG PO CAPS
100.0000 mg | ORAL_CAPSULE | Freq: Two times a day (BID) | ORAL | Status: DC
Start: 2013-12-18 — End: 2013-12-19
  Administered 2013-12-18 – 2013-12-19 (×3): 100 mg via ORAL
  Filled 2013-12-17 (×3): qty 1

## 2013-12-17 MED ORDER — TRIAMTERENE-HCTZ 37.5-25 MG PO TABS
0.5000 | ORAL_TABLET | Freq: Every day | ORAL | Status: DC
  Administered 2013-12-18 – 2013-12-19 (×2): 0.5 via ORAL
  Filled 2013-12-17 (×2): qty 0.5

## 2013-12-17 MED ORDER — BUPIVACAINE HCL (PF) 0.25 % IJ SOLN
INTRAMUSCULAR | Status: DC | PRN
  Administered 2013-12-17 (×2): 10 mL

## 2013-12-17 MED ORDER — GLYCOPYRROLATE 0.2 MG/ML IJ SOLN
INTRAMUSCULAR | Status: AC
  Filled 2013-12-17: qty 4

## 2013-12-17 MED ORDER — ROCURONIUM BROMIDE 100 MG/10ML IV SOLN
INTRAVENOUS | Status: DC | PRN
  Administered 2013-12-17 (×2): 20 mg via INTRAVENOUS
  Administered 2013-12-17: 50 mg via INTRAVENOUS
  Administered 2013-12-17: 10 mg via INTRAVENOUS
  Administered 2013-12-17 (×2): 20 mg via INTRAVENOUS
  Administered 2013-12-17: 10 mg via INTRAVENOUS
  Administered 2013-12-17: 20 mg via INTRAVENOUS

## 2013-12-17 MED ORDER — NEOSTIGMINE METHYLSULFATE 10 MG/10ML IV SOLN
INTRAVENOUS | Status: DC | PRN
  Administered 2013-12-17: 4 mg via INTRAVENOUS

## 2013-12-17 MED ORDER — METOPROLOL SUCCINATE ER 25 MG PO TB24
25.0000 mg | ORAL_TABLET | Freq: Every day | ORAL | Status: DC
  Administered 2013-12-18 – 2013-12-19 (×2): 25 mg via ORAL
  Filled 2013-12-17 (×2): qty 1

## 2013-12-17 MED ORDER — ESTRADIOL 0.1 MG/GM VA CREA
TOPICAL_CREAM | VAGINAL | Status: DC | PRN
  Administered 2013-12-17: 1 via VAGINAL

## 2013-12-17 MED ORDER — DIPHENHYDRAMINE HCL 12.5 MG/5ML PO ELIX: 12.5000 mg | ORAL_SOLUTION | Freq: Four times a day (QID) | ORAL | Status: DC | PRN

## 2013-12-17 MED ORDER — HYDROMORPHONE HCL 1 MG/ML IJ SOLN
INTRAMUSCULAR | Status: DC | PRN
Start: 2013-12-17 — End: 2013-12-17
  Administered 2013-12-17: 1 mg via INTRAVENOUS

## 2013-12-17 MED ORDER — FENTANYL CITRATE 0.05 MG/ML IJ SOLN
INTRAMUSCULAR | Status: DC | PRN
  Administered 2013-12-17 (×3): 100 ug via INTRAVENOUS
  Administered 2013-12-17 (×2): 50 ug via INTRAVENOUS
  Administered 2013-12-17 (×3): 100 ug via INTRAVENOUS

## 2013-12-17 MED ORDER — LIDOCAINE HCL (CARDIAC) 20 MG/ML IV SOLN
INTRAVENOUS | Status: DC | PRN
  Administered 2013-12-17: 50 mg via INTRAVENOUS

## 2013-12-17 MED ORDER — LACTATED RINGERS IV SOLN
INTRAVENOUS | Status: DC
  Administered 2013-12-17 – 2013-12-18 (×2): via INTRAVENOUS

## 2013-12-17 MED ORDER — FENTANYL CITRATE 0.05 MG/ML IJ SOLN
INTRAMUSCULAR | Status: AC
  Filled 2013-12-17: qty 5

## 2013-12-17 MED ORDER — KETOROLAC TROMETHAMINE 30 MG/ML IJ SOLN
INTRAMUSCULAR | Status: DC | PRN
  Administered 2013-12-17: 30 mg via INTRAVENOUS

## 2013-12-17 MED ORDER — METOPROLOL SUCCINATE ER 25 MG PO TB24
25.0000 mg | ORAL_TABLET | Freq: Once | ORAL | Status: AC
  Administered 2013-12-17: 25 mg via ORAL
  Filled 2013-12-17: qty 1

## 2013-12-17 MED ORDER — GLYCOPYRROLATE 0.2 MG/ML IJ SOLN
INTRAMUSCULAR | Status: DC | PRN
  Administered 2013-12-17: .8 mg via INTRAVENOUS

## 2013-12-17 MED ORDER — SODIUM CHLORIDE 0.9 % IJ SOLN: 9.0000 mL | INTRAMUSCULAR | Status: DC | PRN

## 2013-12-17 MED ORDER — NEOSTIGMINE METHYLSULFATE 10 MG/10ML IV SOLN
INTRAVENOUS | Status: AC
Start: 2013-12-17 — End: 2013-12-17
  Filled 2013-12-17: qty 1

## 2013-12-17 MED ORDER — ONDANSETRON HCL 4 MG/2ML IJ SOLN
INTRAMUSCULAR | Status: AC
  Filled 2013-12-17: qty 2

## 2013-12-17 MED ORDER — DEXAMETHASONE SODIUM PHOSPHATE 4 MG/ML IJ SOLN
INTRAMUSCULAR | Status: AC
  Filled 2013-12-17: qty 1

## 2013-12-17 MED ORDER — PROPOFOL 10 MG/ML IV BOLUS
INTRAVENOUS | Status: DC | PRN
  Administered 2013-12-17: 200 mg via INTRAVENOUS

## 2013-12-17 MED ORDER — MIDAZOLAM HCL 2 MG/2ML IJ SOLN
INTRAMUSCULAR | Status: AC
  Filled 2013-12-17: qty 2

## 2013-12-17 MED ORDER — DIPHENHYDRAMINE HCL 50 MG/ML IJ SOLN: 12.5000 mg | Freq: Four times a day (QID) | INTRAMUSCULAR | Status: DC | PRN

## 2013-12-17 MED ORDER — ONDANSETRON HCL 4 MG/2ML IJ SOLN
4.0000 mg | Freq: Four times a day (QID) | INTRAMUSCULAR | Status: DC | PRN
  Administered 2013-12-17: 4 mg via INTRAVENOUS
  Filled 2013-12-17: qty 2

## 2013-12-17 MED ORDER — IBUPROFEN 600 MG PO TABS
600.0000 mg | ORAL_TABLET | Freq: Four times a day (QID) | ORAL | Status: DC | PRN
  Administered 2013-12-18: 600 mg via ORAL
  Filled 2013-12-17: qty 1

## 2013-12-17 MED ORDER — ONDANSETRON HCL 4 MG PO TABS
4.0000 mg | ORAL_TABLET | Freq: Three times a day (TID) | ORAL | Status: DC | PRN
  Administered 2013-12-19: 4 mg via ORAL
  Filled 2013-12-17: qty 1

## 2013-12-17 MED ORDER — OXYCODONE-ACETAMINOPHEN 5-325 MG PO TABS: 1.0000 | ORAL_TABLET | ORAL | Status: DC | PRN

## 2013-12-17 MED ORDER — TRIAMCINOLONE ACETONIDE 40 MG/ML IJ SUSP
40.0000 mg | Freq: Once | INTRAMUSCULAR | Status: DC
  Filled 2013-12-17: qty 1

## 2013-12-17 MED ORDER — HYDROMORPHONE HCL 1 MG/ML IJ SOLN
0.2500 mg | INTRAMUSCULAR | Status: DC | PRN
  Administered 2013-12-17 (×2): 0.5 mg via INTRAVENOUS

## 2013-12-17 MED ORDER — LACTATED RINGERS IV SOLN
INTRAVENOUS | Status: DC
  Administered 2013-12-17 (×4): via INTRAVENOUS

## 2013-12-17 SURGICAL SUPPLY — 61 items
ATTRACTOMAT 16X20 MAGNETIC DRP (DRAPES) ×3 IMPLANT
BLADE SURG 10 STRL SS (BLADE) ×3 IMPLANT
BLADE SURG 11 STRL SS (BLADE) ×6 IMPLANT
CABLE HIGH FREQUENCY MONO STRZ (ELECTRODE) IMPLANT
CATH ROBINSON RED A/P 16FR (CATHETERS) ×3 IMPLANT
CLOTH BEACON ORANGE TIMEOUT ST (SAFETY) ×3 IMPLANT
CONT PATH 16OZ SNAP LID 3702 (MISCELLANEOUS) ×3 IMPLANT
COVER BACK TABLE 60X90IN (DRAPES) ×3 IMPLANT
DECANTER SPIKE VIAL GLASS SM (MISCELLANEOUS) ×3 IMPLANT
DRAPE SHEET LG 3/4 BI-LAMINATE (DRAPES) ×6 IMPLANT
DRSG COVADERM PLUS 2X2 (GAUZE/BANDAGES/DRESSINGS) ×6 IMPLANT
DRSG OPSITE POSTOP 3X4 (GAUZE/BANDAGES/DRESSINGS) IMPLANT
DURAPREP 26ML APPLICATOR (WOUND CARE) ×3 IMPLANT
ELECT REM PT RETURN 9FT ADLT (ELECTROSURGICAL) ×6
ELECTRODE REM PT RTRN 9FT ADLT (ELECTROSURGICAL) ×4 IMPLANT
EVACUATOR SMOKE 8.L (FILTER) ×3 IMPLANT
FORCEPS CUTTING 33CM 5MM (CUTTING FORCEPS) IMPLANT
FORCEPS CUTTING 45CM 5MM (CUTTING FORCEPS) IMPLANT
GAUZE PACKING 2X5 YD STRL (GAUZE/BANDAGES/DRESSINGS) ×3 IMPLANT
GAUZE SPONGE 4X4 16PLY XRAY LF (GAUZE/BANDAGES/DRESSINGS) ×3 IMPLANT
GLOVE BIOGEL PI IND STRL 6.5 (GLOVE) ×6 IMPLANT
GLOVE BIOGEL PI INDICATOR 6.5 (GLOVE) ×3
GLOVE SURG SS PI 6.5 STRL IVOR (GLOVE) ×9 IMPLANT
GOWN STRL REUS W/ TWL LRG LVL3 (GOWN DISPOSABLE) ×14 IMPLANT
GOWN STRL REUS W/TWL LRG LVL3 (GOWN DISPOSABLE) ×7
LIQUID BAND (GAUZE/BANDAGES/DRESSINGS) IMPLANT
NEEDLE MAYO .5 CIRCLE (NEEDLE) ×6 IMPLANT
NS IRRIG 1000ML POUR BTL (IV SOLUTION) ×3 IMPLANT
OCCLUDER COLPOPNEUMO (BALLOONS) ×3 IMPLANT
PACK LAVH (CUSTOM PROCEDURE TRAY) ×3 IMPLANT
PAD MAGNETIC INST (MISCELLANEOUS) ×3 IMPLANT
PAD TRENDELENBURG OR TABLE (MISCELLANEOUS) ×3 IMPLANT
PENCIL BUTTON HOLSTER BLD 10FT (ELECTRODE) ×3 IMPLANT
PROTECTOR NERVE ULNAR (MISCELLANEOUS) IMPLANT
SEALER TISSUE G2 CVD JAW 35 (ENDOMECHANICALS) ×2 IMPLANT
SEALER TISSUE G2 CVD JAW 45CM (ENDOMECHANICALS) ×1
SET IRRIG TUBING LAPAROSCOPIC (IRRIGATION / IRRIGATOR) IMPLANT
SHEARS HARMONIC ACE PLUS 36CM (ENDOMECHANICALS) IMPLANT
SOLUTION ELECTROLUBE (MISCELLANEOUS) IMPLANT
STRIP CLOSURE SKIN 1/4X3 (GAUZE/BANDAGES/DRESSINGS) IMPLANT
SUT CHROMIC 2 0 TIES 18 (SUTURE) ×3 IMPLANT
SUT VIC AB 0 CT1 18XCR BRD8 (SUTURE) ×6 IMPLANT
SUT VIC AB 0 CT1 27 (SUTURE)
SUT VIC AB 0 CT1 27XCR 8 STRN (SUTURE) IMPLANT
SUT VIC AB 0 CT1 8-18 (SUTURE) ×3
SUT VIC AB 2-0 SH 27 (SUTURE) ×4
SUT VIC AB 2-0 SH 27XBRD (SUTURE) ×8 IMPLANT
SUT VIC AB 3-0 PS2 18 (SUTURE) ×1
SUT VIC AB 3-0 PS2 18XBRD (SUTURE) ×2 IMPLANT
SUT VICRYL 0 ENDOLOOP (SUTURE) ×6 IMPLANT
SUT VICRYL 0 TIES 12 18 (SUTURE) ×3 IMPLANT
SUT VICRYL 0 UR6 27IN ABS (SUTURE) ×9 IMPLANT
SYR 20CC LL (SYRINGE) ×3 IMPLANT
SYR 50ML LL SCALE MARK (SYRINGE) ×3 IMPLANT
SYR TB 1ML LUER SLIP (SYRINGE) ×3 IMPLANT
TOWEL OR 17X24 6PK STRL BLUE (TOWEL DISPOSABLE) ×6 IMPLANT
TRAY FOLEY CATH 14FR (SET/KITS/TRAYS/PACK) ×3 IMPLANT
TROCAR BALL TOP DISP 5MM (ENDOMECHANICALS) ×3 IMPLANT
TROCAR XCEL DIL TIP R 11M (ENDOMECHANICALS) ×3 IMPLANT
WARMER LAPAROSCOPE (MISCELLANEOUS) ×3 IMPLANT
WATER STERILE IRR 1000ML POUR (IV SOLUTION) ×3 IMPLANT

## 2013-12-17 NOTE — Transfer of Care (Signed)
Immediate Anesthesia Transfer of Care Note  Patient: Shirley Morris  Procedure(s) Performed: Procedure(s): LAPAROSCOPIC ASSISTED VAGINAL HYSTERECTOMY WITH RIGHT SALPINGO OOPHERECTOMY WITH POSSIBLE LEFT SALPINGO OOPHERECTOMY (N/A) RIGHT SALPINGO OOPHORECTOMY (Right)  Patient Location: PACU  Anesthesia Type:General  Level of Consciousness: awake, oriented and patient cooperative  Airway & Oxygen Therapy: Patient Spontanous Breathing and Patient connected to face mask oxygen  Post-op Assessment: Report given to PACU RN and Post -op Vital signs reviewed and stable  Post vital signs: Reviewed and stable  Complications: No apparent anesthesia complications

## 2013-12-17 NOTE — H&P (Signed)
History and Physical Interval Note:   12/17/2013   9:53 AM   Shirley Morris  has presented today for surgery, with the diagnosis of Pelvic Pain and persistent right ovarian cyst.  The various methods of treatment have been discussed with the patient and family. After consideration of risks, benefits and other options for treatment, the patient has consented to  Procedure(s): LAPAROSCOPIC ASSISTED VAGINAL HYSTERECTOMY WITH RIGHT SALPINGO OOPHERECTOMY WITH POSSIBLE LEFT SALPINGO OOPHERECTOMY RIGHT SALPINGO OOPHORECTOMY as a surgical intervention .  I have examined the patient and reviewed the patients' chart and labs.   She has been cleared by cardiology for surgery after a careful evaluation which included a cardiac catheterization. Questions were answered to the patient's satisfaction and she wishes to proceed.    Hal MoralesHAYGOOD,Avrey Flanagin P  MD

## 2013-12-17 NOTE — Anesthesia Postprocedure Evaluation (Signed)
Anesthesia Post Note  Patient: Shirley Morris  Procedure(s) Performed: Procedure(s) (LRB): LAPAROSCOPIC ASSISTED VAGINAL HYSTERECTOMY WITH RIGHT SALPINGO OOPHERECTOMY WITH POSSIBLE LEFT SALPINGO OOPHERECTOMY (N/A) RIGHT SALPINGO OOPHORECTOMY (Right)  Anesthesia type: General  Patient location: PACU  Post pain: Pain level controlled  Post assessment: Post-op Vital signs reviewed  Last Vitals:  Filed Vitals:   12/17/13 1719  BP: 124/67  Pulse: 104  Temp: 37.2 C  Resp: 18    Post vital signs: Reviewed  Level of consciousness: sedated  Complications: No apparent anesthesia complications

## 2013-12-17 NOTE — Anesthesia Preprocedure Evaluation (Addendum)
Anesthesia Evaluation  Patient identified by MRN, date of birth, ID band Patient awake    Reviewed: Allergy & Precautions, H&P , NPO status , Patient's Chart, lab work & pertinent test results, reviewed documented beta blocker date and time   Airway Mallampati: III  TM Distance: >3 FB Neck ROM: Full    Dental no notable dental hx. (+) Teeth Intact   Pulmonary neg pulmonary ROS,  breath sounds clear to auscultation  Pulmonary exam normal       Cardiovascular hypertension, Pt. on medications and Pt. on home beta blockers + dysrhythmias Supra Ventricular Tachycardia Rhythm:Regular Rate:Normal     Neuro/Psych  Headaches, Depression negative psych ROS   GI/Hepatic negative GI ROS, Neg liver ROS,   Endo/Other  Morbid obesity  Renal/GU negative Renal ROS  negative genitourinary   Musculoskeletal negative musculoskeletal ROS (+)   Abdominal (+) + obese,   Peds  Hematology  (+) anemia ,   Anesthesia Other Findings   Reproductive/Obstetrics Pelvic Pain                            Anesthesia Physical Anesthesia Plan  ASA: III  Anesthesia Plan: General   Post-op Pain Management:    Induction: Intravenous  Airway Management Planned: Oral ETT  Additional Equipment:   Intra-op Plan:   Post-operative Plan: Extubation in OR  Informed Consent: I have reviewed the patients History and Physical, chart, labs and discussed the procedure including the risks, benefits and alternatives for the proposed anesthesia with the patient or authorized representative who has indicated his/her understanding and acceptance.   Dental advisory given  Plan Discussed with: Anesthesiologist, CRNA and Surgeon  Anesthesia Plan Comments:         Anesthesia Quick Evaluation

## 2013-12-17 NOTE — Anesthesia Procedure Notes (Signed)
Procedure Name: Intubation Date/Time: 12/17/2013 10:16 AM Performed by: Shanon PayorGREGORY, Sie Formisano M Pre-anesthesia Checklist: Suction available, Timeout performed, Emergency Drugs available, Patient identified and Patient being monitored Patient Re-evaluated:Patient Re-evaluated prior to inductionOxygen Delivery Method: Circle system utilized Preoxygenation: Pre-oxygenation with 100% oxygen Intubation Type: IV induction Ventilation: Mask ventilation without difficulty Laryngoscope Size: Mac and 3 Grade View: Grade III Tube type: Oral Tube size: 7.0 mm Number of attempts: 2 Airway Equipment and Method: Stylet and Video-laryngoscopy Placement Confirmation: ETT inserted through vocal cords under direct vision,  positive ETCO2,  CO2 detector and breath sounds checked- equal and bilateral Secured at: 21 cm Tube secured with: Tape Dental Injury: Teeth and Oropharynx as per pre-operative assessment  Difficulty Due To: Difficulty was anticipated Comments: DL x1 by CRNA, unable to visualize cords, DLx1 by Dr. Malen GauzeFoster, easy atraumatic intubation with MAC 3 glidescope.

## 2013-12-17 NOTE — Progress Notes (Signed)
Day of Surgery Procedure(s) (LRB): LAPAROSCOPIC ASSISTED VAGINAL HYSTERECTOMY WITH RIGHT SALPINGO OOPHERECTOMY    Subjective: Patient reports no nausea, vomiting and tolerating PO.    Objective: I have reviewed patient's vital signs, intake and output and medications.  General: alert, appears stated age, no distress and moderately obese Resp: clear to auscultation bilaterally Cardio: regular rate and rhythm, S1, S2 normal, no murmur, click, rub or gallop GI: soft, non-tender; bowel sounds normal; no masses,  no organomegaly and incision: dressing dry Extremities: extremities normal, atraumatic, no cyanosis or edema Vaginal Bleeding: none  Assessment: s/p Procedure(s): LAPAROSCOPIC ASSISTED VAGINAL HYSTERECTOMY WITH RIGHT SALPINGO OOPHERECTOMY WITH POSSIBLE LEFT SALPINGO OOPHERECTOMY (N/A) RIGHT SALPINGO OOPHORECTOMY (Right): stable  Plan: Advance diet Encourage ambulation Advance to PO medication in am Discontinue IV fluids in am Discharge home in am  LOS: 0 days    HAYGOOD,VANESSA P 12/17/2013, 8:44 PM

## 2013-12-17 NOTE — Plan of Care (Signed)
Problem: Phase I Progression Outcomes Goal: Pain controlled with appropriate interventions Outcome: Completed/Met Date Met:  12/17/13 Goal: Admission history reviewed Outcome: Completed/Met Date Met:  12/17/13 Goal: Dangle/OOB as tolerated per MD order Outcome: Completed/Met Date Met:  12/17/13 Goal: VS, stable, temp < 100.4 degrees F Outcome: Completed/Met Date Met:  12/17/13 Goal: I & O every 4 hrs or as ordered Outcome: Completed/Met Date Met:  12/17/13 Goal: IS, TCDB as ordered Outcome: Completed/Met Date Met:  12/17/13

## 2013-12-18 ENCOUNTER — Encounter (HOSPITAL_COMMUNITY): Payer: Self-pay | Admitting: Obstetrics and Gynecology

## 2013-12-18 LAB — CBC
HEMATOCRIT: 36.1 % (ref 36.0–46.0)
Hemoglobin: 12.1 g/dL (ref 12.0–15.0)
MCH: 30.3 pg (ref 26.0–34.0)
MCHC: 33.5 g/dL (ref 30.0–36.0)
MCV: 90.5 fL (ref 78.0–100.0)
Platelets: 282 10*3/uL (ref 150–400)
RBC: 3.99 MIL/uL (ref 3.87–5.11)
RDW: 14.1 % (ref 11.5–15.5)
WBC: 14.3 10*3/uL — AB (ref 4.0–10.5)

## 2013-12-18 MED ORDER — MELOXICAM 15 MG PO TABS
15.0000 mg | ORAL_TABLET | Freq: Every day | ORAL | Status: DC
  Administered 2013-12-18 – 2013-12-19 (×2): 15 mg via ORAL
  Filled 2013-12-18 (×3): qty 1

## 2013-12-18 MED ORDER — IBUPROFEN 600 MG PO TABS: 600.0000 mg | ORAL_TABLET | Freq: Four times a day (QID) | ORAL | Status: DC | PRN

## 2013-12-18 MED ORDER — FAMOTIDINE 20 MG PO TABS
20.0000 mg | ORAL_TABLET | Freq: Two times a day (BID) | ORAL | Status: DC | PRN
  Administered 2013-12-19: 20 mg via ORAL
  Filled 2013-12-18: qty 1

## 2013-12-18 MED ORDER — RANITIDINE HCL 150 MG/10ML PO SYRP: 150.0000 mg | ORAL_SOLUTION | Freq: Two times a day (BID) | ORAL | Status: DC | PRN

## 2013-12-18 MED ORDER — TRAMADOL HCL 50 MG PO TABS
50.0000 mg | ORAL_TABLET | Freq: Four times a day (QID) | ORAL | Status: DC
  Administered 2013-12-18 – 2013-12-19 (×6): 50 mg via ORAL
  Filled 2013-12-18 (×6): qty 1

## 2013-12-18 MED ORDER — TRAMADOL HCL 50 MG PO TABS: ORAL_TABLET | ORAL | Status: DC

## 2013-12-18 MED ORDER — TRAMADOL-ACETAMINOPHEN 37.5-325 MG PO TABS: 1.0000 | ORAL_TABLET | ORAL | Status: DC | PRN

## 2013-12-18 MED ORDER — ACETAMINOPHEN 160 MG/5ML PO SOLN
975.0000 mg | Freq: Four times a day (QID) | ORAL | Status: DC
  Administered 2013-12-18 – 2013-12-19 (×5): 975 mg via ORAL
  Filled 2013-12-18 (×5): qty 40

## 2013-12-18 MED ORDER — ALUM & MAG HYDROXIDE-SIMETH 200-200-20 MG/5ML PO SUSP
30.0000 mL | ORAL | Status: DC | PRN
  Administered 2013-12-18: 30 mL via ORAL
  Filled 2013-12-18 (×2): qty 30

## 2013-12-18 MED ORDER — ONDANSETRON HCL 4 MG PO TABS: 4.0000 mg | ORAL_TABLET | Freq: Three times a day (TID) | ORAL | Status: DC | PRN

## 2013-12-18 NOTE — Progress Notes (Signed)
Shirley Morris is [age] y.o.  [id number]  Post Op Date # 1:  LAVH/RSO/Excision of Umbillical Keloid  Subjective: Patient is progressing slowly.  Hasn't used any PCA or oral narcotic due to the fact that they make her feel bad. Patient has dull, intense achy lower pelvic pain but has ambulated in the halls, tried to eat a regualr diet but due to mouth dryness was unable to swallow it down. Reports transient queasiness with amublation in halls, early morning but none recently.  Tolerating liquids, hasn't voided or passed flatus.  Objective: Vital signs in last 24 hours: Temp:  [97.9 F (36.6 C)-99.9 F (37.7 C)] 99.9 F (37.7 C) (11/24 [id number]) Pulse Rate:  [76-104] 92 (11/24 0638) Resp:  [10-19] 18 (11/24 0638) BP: (100-132)/(58-84) 110/73 mmHg (11/24 0638) SpO2:  [98 %-100 %] 100 % (11/24 [id number]) Weight:  [265 lb (120.203 kg)] 265 lb (120.203 kg) (11/23 2300)  Intake/Output from previous day: 11/23 0701 - 11/24 0700 In: 5832.1 [P.O.:680; I.V.:5152.1] Out: 1375 [Urine:1125] Intake/Output this shift:    Recent Labs Lab 12/18/13 0524  WBC 14.3*  HGB 12.1  HCT 36.1  PLT 282    No results for input(s): NA, K, CL, CO2, BUN, CREATININE, CALCIUM, PROT, BILITOT, ALKPHOS, ALT, AST, GLUCOSE in the last 168 hours.  Invalid input(s): LABALBU  EXAM: General: alert, cooperative and mild distress Resp: clear to auscultation bilaterally Cardio: regular rate and rhythm, S1, S2 normal, no murmur, click, rub or gallop GI: Decreased bowel sounds, umbilical dressing with dried blood stain on left lower edge. Extremities: No calft tenderness and negative Homan's Vaginal Bleeding: none   Assessment: s/p Procedure(s): LAPAROSCOPIC ASSISTED VAGINAL HYSTERECTOMY WITH RIGHT SALPINGO OOPHERECTOMY WITH POSSIBLE LEFT SALPINGO OOPHERECTOMY RIGHT SALPINGO OOPHORECTOMY: stable  Plan: Advance diet Encourage ambulation Advance to PO medication To prescribe Tramadol 50 mg (patient agrees to try  this)  Heating pad to abdomen Throat Lozenges to encourage salivation Pt declines discharge home today  LOS: 1 day    POWELL,ELMIRA, PA-C 12/18/2013 7:53 AM

## 2013-12-18 NOTE — Addendum Note (Signed)
Addendum  created 12/18/13 [id number] by Jhonnie GarnerBeth M Mathhew Buysse, CRNA   Modules edited: Notes Section   Notes Section:  File: [id number]

## 2013-12-18 NOTE — Op Note (Signed)
Procedure(s): LAPAROSCOPIC ASSISTED VAGINAL HYSTERECTOMY WITH RIGHT SALPINGO OOPHERECTOMY   Procedure Note  Shirley Morris female 43 y.o. 12/18/2013  Procedure(s) and Anesthesia Type:    * LAPAROSCOPIC ASSISTED VAGINAL HYSTERECTOMY WITH RIGHT SALPINGO OOPHERECTOMY  - General    * RIGHT SALPINGO OOPHORECTOMY - General  Surgeon(s) and Role:    * Hal Morales, MD - Primary   Indications: The patient was admitted to the hospital with a brief history right ovarian cyst which was persistent and increasingly severe pelvic pain. Ultrasound revealed persistence of a 5 cm right ovarian cyst.  There was also abnormality of the endometrial lining which the patient declined to have further characterized by sonohysterogram. Endometrial biopsy revealed findings of no endometrial hyperplasia or carcinoma. The patient now presents for hysterectomy and right salpingo-oophorectomy after discussing therapeutic alternatives.        Surgeon: Hal Morales   Assistants: Henreitta Leber certified physician assistant  Anesthesia: General endotracheal anesthesia  ASA Class: 3    Procedure Detail  LAPAROSCOPIC ASSISTED VAGINAL HYSTERECTOMY WITH RIGHT SALPINGO OOPHERECTOMY   The patient was taken to the operating room after appropriate identification and placed on the operating table. SCD hose were in place and preoperative antibiotic prophylaxis with 2 g of cefotetan and had been completed. After the attainment of adequate general anesthesia she was placed in the modified lithotomy position. The abdomen was prepped with ChloraPrep. The perineum and vagina were prepped with Betadine. A Foley catheter was inserted into the bladder under sterile conditions and connected to straight drainage. A Hulka tenaculum was initially placed on the cervix and intrauterine. The abdomen and perineum were draped as a sterile field. Subumbilical and suprapubic injections of quarter percent Marcaine for a total of  10 cc was undertaken. A keloid that has formed any previous subumbilical incision was sharply excised. The abdomen was entered with a combination of sharp and blunt dissection to allow placement of the [facility] cannula. After the fascia and peritoneum had been marked with sutures. The sutures were used to secure the [facility] cannula and a pneumoperitoneum was created. The laparoscope was placed through the trocar sleeve and suprapubic incisions were made to the right and left of midline. A 5 mm trocar was placed through the left suprapubic incision under direct visualization. A 10 mm trocar was placed into the right suprapubic incision under direct visualization. Pelvic washings were obtained and sent after the above-noted findings had been made.  The Hulka tenaculum had to be removed and replaced with a single-tooth tenaculum to allow better mobilization of the uterus for laparoscopic visualization The right ovary was elevated and lysis of adhesions at the distal portion of the ovary and ovarian cyst with monopolar scissors was undertaken to free the ovary from the sidewall. The utero-ovarian ligament attenuated right round ligament , and right uterotubal junction were then cauterized and cut using an Endo-Seal apparatus.  The incision was then taken along the Mesosalpingeal edge to allow skeletonization of the IP ligament. Once it had been adequately skeletonized, 3 Endoloops were placed to tie off the IP ligament and the ovary and fallopian tube sharply excised, then placed in the posterior cul-de-sac for future retrieval. The tissue planes which defined the upper limits of the anterior leaf of the broad ligament were attenuated and bladder flap was subsequently begun at the mid line anterior cervix. The peritoneal fold was incised and then peritoneal incision taken laterally to the edges of the cervix.   the utero-ovarian ligament, left tubal uterine junction,  and the left round ligament were then cauterized and  cut with endo-seal. Hemostasis was achieved with bipolar cautery. At this point, the decision was made to proceed with the vaginal portion of the procedure. All instruments were removed from the peritoneal cavity except for the trocar sleeves. The surgeon and assistant went below to began the vaginal hysterectomy. A weighted speculum was placed in the posterior vagina and a Lahey tenaculum placed on the cervix. The cervicovaginal junction was then infiltrated with a dilute solution of Pitressin and circumscribed. The bladder was sharply dissected off the anterior cervix and the anterior peritoneum entered and the bladder elevated with a Deaver retractor. The posterior cul-de-sac was entered sharply and marked. The uterosacral ligaments on either side were clamped cut suture-ligated and those sutures held. The uterine arteries on the right and left side were then clamped cut and suture ligated. The remaining the parametrial tissues were clamped cut and suture ligated up to the level of dissection and cutting laparoscopically, allowing the uterus to be removed from the operative field. Hemostatic sutures were needed on the right side to achieve adequate hemostasis. The right ovary and tube which had been placed in the posterior cul-de-sac were not easily identified. At this point and a vaginal occluder was placed in the vagina. The surgeons changed gloves and went to the laparoscopic seal to again identify the resected right tube and still intact ovarian cyst and 2 push it through the vaginal opening for vaginal retrieval. During the time that the vaginal occluder was in place and adequate pneumoperitoneum was maintained to allow the visualization of hemostasis and bipolar cautery was used to improve hemostasis on the right pelvic sidewall. Copious irrigation was carried out and the surgeon returned to the vagina for closure of the vaginal cuff which was achieved by utilizing the sutures which were held from each  uterosacral ligament for the angle sutures which were then tied down. The remainder of the vaginal cuff was closed in a single layer with figure-of-eight sutures to allow for closure and adequate hemostasis. A vaginal packing moistened with Estrace cream was placed in the vagina. The surgeon returned to the laparoscopic field to visualize the peristalsing ureters bilaterally and 2 document the achievement of adequate hemostasis. Copious irrigation was again undertaken and all instruments removed from the peritoneal cavity under direct visualization as the CO2 was allowed to escape. The left suprapubic incision was closed with Dermabond. The right suprapubic incision was closed with a fascial suture of 0 Vicryl in the feet. The fascia and then the skin with Dermabond. The subumbilical incision was closed with the fascial sutures which had been placed initially in a figure-of-eight fashion. The subcutaneous tissue was infiltrated with a total of 40 mg of Kenalog and 10 cc of quarter percent Marcaine. The skin incision was closed with a subcuticular suture of 3-0 Vicryl. All sutures used during the vaginal hysterectomy were 0 Vicryl. All instruments having been removed both from the vagina and from the abdominal cavity, and the sponge and instrument counts. The noted as correct, the patient was awakened from general anesthesia and taken to the recovery room in satisfactory condition having tolerated the procedure well.  Findings: The uterus was normal size. The right ovary was enlarged to approximately 5 cm with a large cystic structure essentially overtaking the stroma. The ovarian cyst was adherent to the overlying fallopian tube which had been attenuated all the way to the fimbriated end. The left ovary was normal size but adherent to  the left pelvic sidewall with the left fallopian tube somewhat adherent. The round ligaments were attenuated bilaterally. The air was scarring at the pelvic peritoneum anterior  cul-de-sac area, the site of previous cesarean section.  Estimated Blood Loss:  250 cc         Drains: None         Blood Given: none          Specimens: Uterus and cervix, right fallopian tube and ovary         Implants: none        Complications: None         Disposition: PACU - hemodynamically stable.         Condition: stable

## 2013-12-18 NOTE — Progress Notes (Signed)
1 Day Post-Op Procedure(s) (LRB): LAPAROSCOPIC ASSISTED VAGINAL HYSTERECTOMY WITH RIGHT SALPINGO OOPHERECTOMY    Subjective: Patient reports no nausea, vomiting, has incisional pain, is tolerating PO, no flatus, no BM yet but no problems voiding.   Took only single dose of PCA last night because it "made me feel drunk".  Declined Percocet today.  Has had tramadol and Motrin only.  Pain is 7/10.  Was told to minimize ibuprofen by her GI surgeon s/p gastric bypass.  Takes Meloxicam daily.  Doesn't want to go home today, but is not specific about reason.  Objective: I have reviewed patient's vital signs, intake and output, medications and labs.  General: alert, cooperative, mild distress and moderately obese Resp: clear to auscultation bilaterally Cardio: regular rate and rhythm, S1, S2 normal, no murmur, click, rub or gallop GI: soft, non-tender; bowel sounds normal; no masses,  no organomegaly. Incision: clean, dry, intact and bloody drainage present on dressing which was stable from last night Extremities: extremities normal, atraumatic, no cyanosis or edema Vaginal Bleeding: minimal on vaginal pack  Assessment: s/p Procedure(s): LAPAROSCOPIC ASSISTED VAGINAL HYSTERECTOMY WITH RIGHT SALPINGO OOPHERECTOMY  : stable, progressing well, tolerating diet and pain managemtn complicated by previous gastric bypass and patient preference  Plan: Advance diet Encourage ambulation Advance to PO medication Discontinue IV fluids Discharge home planning done Discussed alternate pain management plan to exclude narcotics at patient's request.  Will give tylenol oral suspension and tramadol &assess pain relief for home management  LOS: 1 day    Rage Beever P 12/18/2013, 10:27 AM

## 2013-12-18 NOTE — Progress Notes (Signed)
Pt was sitting on side of bed when I arrived; husband was bedside. Explained adv dir to pt and husband and gave her information. She said she wanted to know about the information but not necessarily to complete info today. I encouraged her to return the form at her convenience. Pt and husband were thankful for information given. Shirley Morris Chaplain   12/18/13 1600  Clinical Encounter Type  Visited With Patient and family together

## 2013-12-18 NOTE — Anesthesia Postprocedure Evaluation (Signed)
Anesthesia Post Note  Patient: Shirley Morris  Procedure(s) Performed: Procedure(s) (LRB): LAPAROSCOPIC ASSISTED VAGINAL HYSTERECTOMY WITH RIGHT SALPINGO OOPHERECTOMY WITH POSSIBLE LEFT SALPINGO OOPHERECTOMY (N/A) RIGHT SALPINGO OOPHORECTOMY (Right)  Anesthesia type: General  Patient location: Mother/Baby  Post pain: Pain level controlled  Post assessment: Post-op Vital signs reviewed  Last Vitals:  Filed Vitals:   12/18/13 0638  BP: 110/73  Pulse: 92  Temp: 37.7 C  Resp: 18    Post vital signs: Reviewed  Level of consciousness: awake and alert   Complications: No apparent anesthesia complications

## 2013-12-18 NOTE — Plan of Care (Signed)
Problem: Phase II Progression Outcomes Goal: Pain controlled on oral analgesia Outcome: Completed/Met Date Met:  12/18/13 Goal: Progress activity as tolerated unless otherwise ordered Outcome: Completed/Met Date Met:  12/18/13 Goal: Afebrile, VS remain stable Outcome: Completed/Met Date Met:  12/18/13 Goal: Foley discontinued Outcome: Completed/Met Date Met:  12/18/13 Goal: Voiding trials/Bladder training within 48 hrs Outcome: Completed/Met Date Met:  12/18/13 Goal: Incision/dressings dry and intact Outcome: Completed/Met Date Met:  12/18/13

## 2013-12-18 NOTE — Discharge Instructions (Signed)
Call [facility] @ [phone] if:  You have a temperature greater than or equal to 100.4 degrees Farenheit orally You have pain that is not made better by the pain medication given and taken as directed You have excessive bleeding or problems urinating  Take Colace (Docusate Sodium/Stool Softener) 100 mg 2-3 times daily while taking narcotic pain medicine to avoid constipation or until bowel movements are regular. Take iron supplement twice a day for 8 weeks  You may drive after 2  weeks You may walk up steps You may shower   You may resume a regular diet Keep incisions clean and dry Do not lift over 15 pounds for 6 weeks Avoid anything in vagina for 6 weeks (or until after your post-operative visit)  Keep post operative visit with Dr. Pennie RushingHaygood on January 6 at 10:30 a.m.

## 2013-12-19 DIAGNOSIS — N83209 Unspecified ovarian cyst, unspecified side: Secondary | ICD-10-CM | POA: Diagnosis present

## 2013-12-19 DIAGNOSIS — D62 Acute posthemorrhagic anemia: Secondary | ICD-10-CM | POA: Diagnosis not present

## 2013-12-19 DIAGNOSIS — Z9884 Bariatric surgery status: Secondary | ICD-10-CM | POA: Diagnosis not present

## 2013-12-19 DIAGNOSIS — R102 Pelvic and perineal pain: Secondary | ICD-10-CM | POA: Diagnosis present

## 2013-12-19 DIAGNOSIS — Z6841 Body Mass Index (BMI) 40.0 and over, adult: Secondary | ICD-10-CM

## 2013-12-19 DIAGNOSIS — L910 Hypertrophic scar: Secondary | ICD-10-CM | POA: Diagnosis present

## 2013-12-19 DIAGNOSIS — N8320 Unspecified ovarian cysts: Secondary | ICD-10-CM | POA: Diagnosis present

## 2013-12-19 LAB — CBC WITH DIFFERENTIAL/PLATELET
BASOS PCT: 0 % (ref 0–1)
Basophils Absolute: 0 10*3/uL (ref 0.0–0.1)
EOS PCT: 3 % (ref 0–5)
Eosinophils Absolute: 0.3 10*3/uL (ref 0.0–0.7)
HCT: 34 % — ABNORMAL LOW (ref 36.0–46.0)
HEMOGLOBIN: 11.3 g/dL — AB (ref 12.0–15.0)
Lymphocytes Relative: 26 % (ref 12–46)
Lymphs Abs: 2.4 10*3/uL (ref 0.7–4.0)
MCH: 30.3 pg (ref 26.0–34.0)
MCHC: 33.2 g/dL (ref 30.0–36.0)
MCV: 91.2 fL (ref 78.0–100.0)
MONO ABS: 0.7 10*3/uL (ref 0.1–1.0)
MONOS PCT: 7 % (ref 3–12)
Neutro Abs: 5.7 10*3/uL (ref 1.7–7.7)
Neutrophils Relative %: 64 % (ref 43–77)
Platelets: 278 10*3/uL (ref 150–400)
RBC: 3.73 MIL/uL — ABNORMAL LOW (ref 3.87–5.11)
RDW: 14.6 % (ref 11.5–15.5)
WBC: 9 10*3/uL (ref 4.0–10.5)

## 2013-12-19 MED ORDER — FAMOTIDINE 20 MG PO TABS: 20.0000 mg | ORAL_TABLET | Freq: Two times a day (BID) | ORAL | Status: DC | PRN

## 2013-12-19 MED ORDER — TRAMADOL HCL 50 MG PO TABS: ORAL_TABLET | ORAL | Status: DC

## 2013-12-19 MED ORDER — ACETAMINOPHEN 160 MG/5ML PO SOLN
975.0000 mg | Freq: Four times a day (QID) | ORAL | Status: DC
Start: 2013-12-19 — End: 2013-12-19

## 2013-12-19 MED ORDER — MELOXICAM 15 MG PO TABS: 15.0000 mg | ORAL_TABLET | Freq: Every day | ORAL | Status: DC

## 2013-12-19 MED ORDER — ACETAMINOPHEN 160 MG/5ML PO SOLN: 975.0000 mg | Freq: Four times a day (QID) | ORAL | Status: DC

## 2013-12-19 NOTE — Progress Notes (Signed)
Pt discharged home with husband... Discharge instructions reviewed with pt and she verbalized understanding... Condition stable... No equipment... Take to car via wheelchair by Selinda MichaelsE. Tedi Hughson, RN.

## 2013-12-19 NOTE — Progress Notes (Signed)
Shirley Morris [age] y.o.  [id number]  Post Op Date # 2:  LAVH/RSO  Subjective: Patient is much improved post operatively as she has achieved good pain  control with a combination of her chronic daily Melixicam, with addedTramdol and Tylenol Suspension.  Tolerating a regular diet, voiding, passing flatus and  ambulating without difficulty.  Objective: Vital signs in last 24 hours: Temp:  [98.6 F (37 C)-99.6 F (37.6 C)] 98.6 F (37 C) (11/25 0915) Pulse Rate:  [80-99] 91 (11/25 0915) Resp:  [16-18] 18 (11/25 0915) BP: (112-129)/(66-79) 129/78 mmHg (11/25 0915) SpO2:  [100 %] 100 % (11/25 0915)  Intake/Output from previous day: 11/24 0701 - 11/25 0700 In: 360 [P.O.:360] Out: 1050 [Urine:1050] Intake/Output this shift:    Recent Labs Lab 12/18/13 0524 12/19/13 0710  WBC 14.3* 9.0  HGB 12.1 11.3*  HCT 36.1 34.0*  PLT 282 278     EXAM: General: alert, cooperative and no distress Resp: clear to auscultation bilaterally Cardio: regular rate and rhythm, S1, S2 normal, no murmur, click, rub or gallop GI: Bowel sound present, umbilical dressing with dried blood stain on left edge; lower abdominal incisoins intact with no evidence of infection. Extremities: No calf tenderness and negative Homan's Vaginal Bleeding: none   Assessment: s/p Procedure(s): LAPAROSCOPIC ASSISTED VAGINAL HYSTERECTOMY WITH RIGHT SALPINGO OOPHERECTOMY : stable, progressing well and tolerating diet with pain well controlled  Plan: Discharge home  Tramadol 50 mg every 6 hours prn-pain Tylenol Suspension every 6 hours prn-pain Continue pre-op meds including Meloxicam  LOS: 2 days    Jesslyn Viglione P, PA-C 12/19/2013 9:58 AM

## 2013-12-19 NOTE — Discharge Summary (Signed)
Physician Discharge Summary  Patient ID: Shirley Morris MRN: [id number] DOB/AGE: [birth year] 43 y.o.  Admit date: 12/17/2013 Discharge date: 12/19/2013   Discharge Diagnoses: Chronic Pelvic Pain, Umbilical Keloid and Complex Right Ovarian Cyst Active Problems:   Pelvic pain in female   Right ovarian cyst   Operation: Laparoscopically Assisted Vaginal Hysterectomy with Right Salpingo-oophorectomy and Excision of Umbilical Keloid   Discharged Condition: Good  Hospital Course: On the date of admission the patient underwent the aforementioned procedures and tolerated them well.  Post operative course was marked by poor pain control due to the patient's intolerance of narcotics and gastrointestinal absorption issues.  She was placed on a combination of Tramdol and Tylenol Suspension that eventually  provided acceptable analgesia.  By post operative day #2 the patient had resumed bowel and bladder function, had good pain control and tolerated a hemoglobin of 11.3.  Having received the maximum benefit of her hospital stay the patient was deemed ready for discharge on post operative day #2.   Disposition: 01-Home or Self Care  Discharge Medications:    Medication List    STOP taking these medications        IRON PO     medroxyPROGESTERone 150 MG/ML injection  Commonly known as:  DEPO-PROVERA      TAKE these medications        acetaminophen 160 MG/5ML solution  Commonly known as:  TYLENOL  Take 30.5 mLs (975 mg total) by mouth every 6 (six) hours.     calcium citrate 950 MG tablet  Commonly known as:  CALCITRATE - dosed in mg elemental calcium  Take 1 tablet by mouth daily.     famotidine 20 MG tablet  Commonly known as:  PEPCID  Take 1 tablet (20 mg total) by mouth 2 (two) times daily as needed for heartburn or indigestion.     ferrous sulfate 325 (65 FE) MG tablet  Take 325 mg by mouth daily with breakfast.     fluocinonide cream 0.05 %  Commonly known as:  LIDEX   Apply 1 application topically daily as needed. Eczema     imipramine 25 MG tablet  Commonly known as:  TOFRANIL  Take 75 mg by mouth at bedtime.     meloxicam 15 MG tablet  Commonly known as:  MOBIC  Take 1 tablet (15 mg total) by mouth daily after breakfast.     metoprolol succinate 25 MG 24 hr tablet  Commonly known as:  TOPROL XL  Take 1 tablet (25 mg total) by mouth daily.     multivitamin tablet  Take 1 tablet by mouth daily.     ondansetron 4 MG tablet  Commonly known as:  ZOFRAN  Take 1 tablet (4 mg total) by mouth every 8 (eight) hours as needed for nausea or vomiting.     traMADol 50 MG tablet  Commonly known as:  ULTRAM  1  po  every 6 hours as needed for pain     triamterene-hydrochlorothiazide 37.5-25 MG per tablet  Commonly known as:  MAXZIDE-25  Take 0.5 tablets by mouth daily.     VITAMIN D PO  Take 1 tablet by mouth daily.           Follow-up: Dr. Erie NoeVanessa P. Haygood on January 30, 2014 at 10:30 a.m.   SignedHenreitta Leber: Park Beck, PA-C 12/19/2013, 7:59 AM

## 2013-12-19 NOTE — Plan of Care (Signed)
Problem: Discharge Progression Outcomes Goal: Barriers To Progression Addressed/Resolved Outcome: Completed/Met Date Met:  12/19/13 Goal: Discharge plan in place and appropriate Outcome: Completed/Met Date Met:  12/19/13 Goal: Pain controlled with appropriate interventions Outcome: Completed/Met Date Met:  12/19/13 Goal: Hemodynamically stable Outcome: Completed/Met Date Met:  61/51/83 Goal: Complications resolved/controlled Outcome: Completed/Met Date Met:  12/19/13 Goal: Tolerating diet Outcome: Completed/Met Date Met:  12/19/13 Goal: Activity appropriate for discharge plan Outcome: Completed/Met Date Met:  12/19/13 Goal: Other Discharge Outcomes/Goals Outcome: Completed/Met Date Met:  12/19/13

## 2013-12-28 ENCOUNTER — Encounter (HOSPITAL_COMMUNITY): Admission: RE | Payer: Self-pay | Source: Ambulatory Visit

## 2013-12-28 SURGERY — REPAIR, DEHISCENCE, WOUND, ABDOMEN
Anesthesia: Choice

## 2014-01-01 ENCOUNTER — Ambulatory Visit (HOSPITAL_COMMUNITY)
Admission: RE | Admit: 2014-01-01 | Payer: BC Managed Care – PPO | Source: Ambulatory Visit | Admitting: Obstetrics and Gynecology

## 2014-01-03 ENCOUNTER — Encounter (HOSPITAL_COMMUNITY): Payer: Self-pay | Admitting: Cardiology

## 2014-01-04 ENCOUNTER — Encounter (HOSPITAL_BASED_OUTPATIENT_CLINIC_OR_DEPARTMENT_OTHER): Payer: BC Managed Care – PPO | Attending: Internal Medicine

## 2014-01-04 DIAGNOSIS — S31109A Unspecified open wound of abdominal wall, unspecified quadrant without penetration into peritoneal cavity, initial encounter: Secondary | ICD-10-CM | POA: Diagnosis not present

## 2014-01-04 DIAGNOSIS — T814XXA Infection following a procedure, initial encounter: Secondary | ICD-10-CM | POA: Insufficient documentation

## 2014-01-05 NOTE — Progress Notes (Signed)
[facility]  NAME:  Shirley Morris, Shirley Morris        ACCOUNT NO.:  [id number]  MEDICAL RECORD NO.:  [id number]      DATE OF BIRTH:  [birth year]  PHYSICIAN:  Maxwell CaulMichael G. Robson, M.D. VISIT DATE:  01/04/2014                                  OFFICE VISIT   This is a patient who underwent a LAVH with salpingo-oophorectomy on December 01, 2013.  She appears to have tolerated the procedure well, which was done for endometriosis.  In any case shortly thereafter perhaps 2-3 days, she developed dehiscence of the wound.  She tells me that an attempt was made to re-suture, however, she developed a complicating infection and the sutures were removed.  Culture of the wound, which was apparently draining purulent drainage per the patient grew methicillin-resistant staph Aureus.  She was put on Septra and Augmentin.  Apparently she is being seen by Home Health at home on a daily basis, dressing with calcium alginate.  She has remained systemically well.  PAST MEDICAL HISTORY: 1. Anemia. 2. Angina. 3. Arrhythmia. 4. Hypertension. 5. Gout. 6. Osteoarthritis. 7. Cholecystectomy. 8. Endometrial biopsy, LVH, and salpingo-oophorectomy on November     17th. 9. History of skin keloid removal. 10.Arthroscopic knee surgery. 11.Colposcopy. 12.Gastric bypass. 13.Cesarean section.  CURRENT MEDICATIONS: 1. Famotidine 20 mg daily p.r.n. 2. Imipramine 75 daily. 3. Ferrous sulfate 325 daily. 4. Meloxicam 15 mg daily. 5. Metoprolol ER 25 daily. 6. Percocet 5/325 q.4 p.r.n. 7. Tramadol 50 q.6 p.r.n. 8. Septra DS 1 daily x7 days. 9. Augmentin 500 b.i.d. x7 days. 10.Triamterene/hydrochlorothiazide 37.5/25 daily.  PHYSICAL EXAMINATION:  The patient did not appear systemically unwell. Temperature 98.6, pulse 97, respirations 17, blood pressure is 99/73. The major wound was in the upper pelvic area surgical area measuring 2.6 x 1.6 x 4.  I did a light surface debridement of some  purulent-looking material.  There was no surrounding tenderness.  No evidence of serious soft tissue infection.  There was a smaller suprapubic wound on the right measuring 0.8 x 1.4 x 0.8. This was small and superficial, healthy granulation. Her abdomen and Pelvis are not tender.   IMPRESSION:  Surgical wound nonhealing after a surgery for hysterectomy secondary to endometriosis.  She developed a wound infection with MRSA. I have changed her calcium alginate dressing to silver alginate, which should help with the MRSA.  I have not changed her existing antibiotics. There was no evidence of a serious soft tissue infection at this point, and according to the patient, things seemed to be improved.  The dressing will be changed by Home Health as currently apparently they are coming daily to do this dressing.  I will see her again in a week's time.          ______________________________ Maxwell CaulMichael G. Robson, M.D.     MGR/MEDQ  D:  01/04/2014  T:  01/05/2014  Job:  [id number]

## 2014-01-11 DIAGNOSIS — T814XXA Infection following a procedure, initial encounter: Secondary | ICD-10-CM | POA: Diagnosis not present

## 2014-01-11 DIAGNOSIS — S31109A Unspecified open wound of abdominal wall, unspecified quadrant without penetration into peritoneal cavity, initial encounter: Secondary | ICD-10-CM | POA: Diagnosis not present

## 2014-01-24 DIAGNOSIS — T814XXA Infection following a procedure, initial encounter: Secondary | ICD-10-CM | POA: Diagnosis not present

## 2014-01-24 DIAGNOSIS — S31109A Unspecified open wound of abdominal wall, unspecified quadrant without penetration into peritoneal cavity, initial encounter: Secondary | ICD-10-CM | POA: Diagnosis not present

## 2014-02-01 ENCOUNTER — Encounter (HOSPITAL_BASED_OUTPATIENT_CLINIC_OR_DEPARTMENT_OTHER): Payer: BC Managed Care – PPO | Attending: Internal Medicine

## 2014-02-01 DIAGNOSIS — S31109A Unspecified open wound of abdominal wall, unspecified quadrant without penetration into peritoneal cavity, initial encounter: Secondary | ICD-10-CM | POA: Insufficient documentation

## 2014-02-01 DIAGNOSIS — B9562 Methicillin resistant Staphylococcus aureus infection as the cause of diseases classified elsewhere: Secondary | ICD-10-CM | POA: Diagnosis not present

## 2014-02-01 DIAGNOSIS — T814XXA Infection following a procedure, initial encounter: Secondary | ICD-10-CM | POA: Insufficient documentation

## 2014-02-01 DIAGNOSIS — Y839 Surgical procedure, unspecified as the cause of abnormal reaction of the patient, or of later complication, without mention of misadventure at the time of the procedure: Secondary | ICD-10-CM | POA: Diagnosis not present

## 2014-02-08 DIAGNOSIS — T814XXA Infection following a procedure, initial encounter: Secondary | ICD-10-CM | POA: Diagnosis not present

## 2014-02-08 DIAGNOSIS — B9562 Methicillin resistant Staphylococcus aureus infection as the cause of diseases classified elsewhere: Secondary | ICD-10-CM | POA: Diagnosis not present

## 2014-02-08 DIAGNOSIS — S31109A Unspecified open wound of abdominal wall, unspecified quadrant without penetration into peritoneal cavity, initial encounter: Secondary | ICD-10-CM | POA: Diagnosis not present

## 2014-02-11 DIAGNOSIS — T814XXA Infection following a procedure, initial encounter: Secondary | ICD-10-CM | POA: Diagnosis not present

## 2014-02-21 DIAGNOSIS — B9562 Methicillin resistant Staphylococcus aureus infection as the cause of diseases classified elsewhere: Secondary | ICD-10-CM | POA: Diagnosis not present

## 2014-02-21 DIAGNOSIS — S31109A Unspecified open wound of abdominal wall, unspecified quadrant without penetration into peritoneal cavity, initial encounter: Secondary | ICD-10-CM | POA: Diagnosis not present

## 2014-02-21 DIAGNOSIS — T814XXA Infection following a procedure, initial encounter: Secondary | ICD-10-CM | POA: Diagnosis not present

## 2014-02-22 NOTE — Progress Notes (Signed)
[facility]  NAME:  [name], Shirley        ACCOUNT NO.:  [id number]  MEDICAL RECORD NO.:  [id number]      DATE OF BIRTH:  [birth year]  PHYSICIAN:  Maxwell CaulMichael G. Robson, M.D. VISIT DATE:  02/21/2014                                  OFFICE VISIT   This is a patient who underwent a laparoscopic anterior vaginal hysterectomy with salpingo-oophorectomy on November 7th.  Initially presenting here on December 11th.  She had nonhealing surgical wounds. Culture of the superior wound had actually shown methicillin Staph aureus.  She was treated with oral antibiotics.  She was ultimately started on a wound VAC.  Her nonhealing surgical sites have responded nicely to this.  There is healthy granulation at the base of both of these today.  I think the smaller incision inferiorly is no longer easy to wound VAC, therefore we will use collagen, Hydrogel, and foam.  I think we can continue to use the VAC on the superior wound, which appears to be closing down nicely.  IMPRESSION:  Nonhealing surgical wounds.  There is no evidence of surrounding infection here.  There is still some sanguinous drainage according to the patient, although there is no evidence of ongoing infection.  As mentioned above, we will continue the wound VAC to the superior wound and apply silver collagen based dressing to the lower wound.  Both of these areas appear to be healthy and are improving gradually.          ______________________________ Maxwell CaulMichael G. Robson, M.D.     MGR/MEDQ  D:  02/21/2014  T:  02/22/2014  Job:  [id number]

## 2014-02-26 ENCOUNTER — Encounter (HOSPITAL_BASED_OUTPATIENT_CLINIC_OR_DEPARTMENT_OTHER): Payer: BC Managed Care – PPO | Attending: Internal Medicine

## 2014-02-26 DIAGNOSIS — S31109S Unspecified open wound of abdominal wall, unspecified quadrant without penetration into peritoneal cavity, sequela: Secondary | ICD-10-CM | POA: Insufficient documentation

## 2014-02-26 DIAGNOSIS — T8189XS Other complications of procedures, not elsewhere classified, sequela: Secondary | ICD-10-CM | POA: Insufficient documentation

## 2014-02-26 DIAGNOSIS — A4902 Methicillin resistant Staphylococcus aureus infection, unspecified site: Secondary | ICD-10-CM | POA: Diagnosis not present

## 2014-02-26 DIAGNOSIS — Y839 Surgical procedure, unspecified as the cause of abnormal reaction of the patient, or of later complication, without mention of misadventure at the time of the procedure: Secondary | ICD-10-CM | POA: Diagnosis not present

## 2014-03-01 ENCOUNTER — Encounter (HOSPITAL_BASED_OUTPATIENT_CLINIC_OR_DEPARTMENT_OTHER): Payer: BC Managed Care – PPO

## 2014-03-01 ENCOUNTER — Ambulatory Visit (INDEPENDENT_AMBULATORY_CARE_PROVIDER_SITE_OTHER): Payer: BC Managed Care – PPO | Admitting: Cardiovascular Disease

## 2014-03-01 ENCOUNTER — Encounter: Payer: Self-pay | Admitting: Cardiovascular Disease

## 2014-03-01 VITALS — BP 120/90 | HR 88 | Ht 65.0 in | Wt 260.8 lb

## 2014-03-01 DIAGNOSIS — R0789 Other chest pain: Secondary | ICD-10-CM

## 2014-03-01 DIAGNOSIS — A4902 Methicillin resistant Staphylococcus aureus infection, unspecified site: Secondary | ICD-10-CM | POA: Diagnosis not present

## 2014-03-01 DIAGNOSIS — T8189XS Other complications of procedures, not elsewhere classified, sequela: Secondary | ICD-10-CM | POA: Diagnosis not present

## 2014-03-01 DIAGNOSIS — S31109S Unspecified open wound of abdominal wall, unspecified quadrant without penetration into peritoneal cavity, sequela: Secondary | ICD-10-CM | POA: Diagnosis not present

## 2014-03-01 DIAGNOSIS — I10 Essential (primary) hypertension: Secondary | ICD-10-CM

## 2014-03-01 NOTE — Progress Notes (Signed)
Cardiology Office Note   Date:  03/01/2014   ID:  Shirley Morris, DOB [birth year], MRN [id number]  PCP:  Johny Blamer, MD  Cardiologist:   Vesta Mixer, MD   Chief Complaint  Patient presents with  . Follow-up    HTN    1. Hypertension 2. History of obesity-status post gastric bypass 3. Chest pain 4. Supraventricular tachycardia   History of Present Illness: Shirley Morris is a 44 y.o. female who presents for follow up of her HTN  she had  GYN surgery . Her pre-op myoview showed some abnormalities but the subsequent cardiac cath  was completely normal.   She had a hysterectomy and unfortunate had some healing complications  related to that. She still getting wound care.  BP and heart rate have remained normal.   Past Medical History  Diagnosis Date  . SVT (supraventricular tachycardia)   . Hypertension   . Migraine   . Palpitations   . Chest pain     Negative stress echo in February of 2011  . Tear of meniscus of right knee   . Injury of thumb, right   . Cesarean delivery delivered     for fetal distress  . Family history of colon cancer   . Constipation   . Anemia     Past Surgical History  Procedure Laterality Date  . Knee arthroscopy  2011  . US echocardiography  11/26/2008    EF 55-60%  . 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 Cesarean section    . Laparoscopic assisted vaginal hysterectomy Right 12/17/2013    Procedure: LAPAROSCOPIC ASSISTED VAGINAL HYSTERECTOMY WITH RIGHT SALPINGO OOPHERECTOMY ;  Surgeon: Hal Morales, MD;  Location: [facility];  Service: Gynecology;  Laterality: Right;  . Left heart catheterization with coronary angiogram N/A 12/11/2013    Procedure: LEFT HEART CATHETERIZATION WITH CORONARY ANGIOGRAM;  Surgeon: Peter M Swaziland, MD;  Location: [facility] CATH LAB;  Service: Cardiovascular;  Laterality: N/A;     Current Outpatient Prescriptions  Medication Sig Dispense Refill  . acetaminophen (TYLENOL) 160 MG/5ML  solution Take 30.5 mLs (975 mg total) by mouth every 6 (six) hours. Take 975 mg arouhnd the clock every 6 hours for 3 more days, then as needed for pain. 480 mL 3  . calcium citrate (CALCITRATE - DOSED IN MG ELEMENTAL CALCIUM) 950 MG tablet Take 1 tablet by mouth daily.      . Cholecalciferol (VITAMIN D PO) Take 1 tablet by mouth daily.     . famotidine (PEPCID) 20 MG tablet Take 1 tablet (20 mg total) by mouth 2 (two) times daily as needed for heartburn or indigestion. 30 tablet 0  . ferrous sulfate 325 (65 FE) MG tablet Take 325 mg by mouth daily with breakfast.    . fluocinonide cream (LIDEX) 0.05 % Apply 1 application topically daily as needed. Eczema  0  . imipramine (TOFRANIL) 25 MG tablet Take 75 mg by mouth at bedtime.     . meloxicam (MOBIC) 15 MG tablet Take 1 tablet (15 mg total) by mouth daily after breakfast. 30 tablet 3  . metoprolol succinate (TOPROL XL) 25 MG 24 hr tablet Take 1 tablet (25 mg total) by mouth daily. 90 tablet 3  . Multiple Vitamin (MULTIVITAMIN) tablet Take 1 tablet by mouth daily.      . ondansetron (ZOFRAN) 4 MG tablet Take 1 tablet (4 mg total) by mouth every 8 (eight) hours as needed for nausea or vomiting. 20  tablet 0  . oxyCODONE-acetaminophen (PERCOCET/ROXICET) 5-325 MG per tablet Take by mouth daily as needed. FOR PAIN  0  . traMADol (ULTRAM) 50 MG tablet 1  po  every 6 hours as needed for pain 30 tablet 0  . triamterene-hydrochlorothiazide (MAXZIDE-25) 37.5-25 MG per tablet Take 0.5 tablets by mouth daily.       No current facility-administered medications for this visit.    Allergies:   Review of patient's allergies indicates no known allergies.    Social History:  The patient  reports that she has never smoked. She has never used smokeless tobacco. She reports that she does not drink alcohol or use illicit drugs.   Family History:  The patient's family history includes Colon cancer in her cousin and maternal uncle; Coronary artery disease in her  father; Diabetes in her father; Hypertension in her father.    ROS:  Please see the history of present illness.    Review of Systems: Constitutional:  denies fever, chills, diaphoresis, appetite change and fatigue.  HEENT: denies photophobia, eye pain, redness, hearing loss, ear pain, congestion, sore throat, rhinorrhea, sneezing, neck pain, neck stiffness and tinnitus.  Respiratory: denies SOB, DOE, cough, chest tightness, and wheezing.  Cardiovascular: denies chest pain, palpitations and leg swelling.  Gastrointestinal: denies nausea, vomiting, abdominal pain, diarrhea, constipation, blood in stool.  Genitourinary: denies dysuria, urgency, frequency, hematuria, flank pain and difficulty urinating.  Musculoskeletal: denies  myalgias, back pain, joint swelling, arthralgias and gait problem.   Skin: denies pallor, rash and wound.  Neurological: denies dizziness, seizures, syncope, weakness, light-headedness, numbness and headaches.   Hematological: denies adenopathy, easy bruising, personal or family bleeding history.  Psychiatric/ Behavioral: denies suicidal ideation, mood changes, confusion, nervousness, sleep disturbance and agitation.       All other systems are reviewed and negative.    PHYSICAL EXAM: VS:  BP 120/90 mmHg  Pulse 88  Ht  (1.651 m)  Wt 260 lb 12.8 oz (118.298 kg)  BMI 43.40 kg/m2 , BMI Body mass index is 43.4 kg/(m^2). GEN: Well nourished, well developed, in no acute distress HEENT: normal Neck: no JVD, carotid bruits, or masses Cardiac: RRR; no murmurs, rubs, or gallops,no edema  Respiratory:  clear to auscultation bilaterally, normal work of breathing GI: soft, nontender, nondistended, + BS MS: no deformity or atrophy Skin: warm and dry, no rash Neuro:  Strength and sensation are intact Psych: normal   EKG:  EKG is not ordered today.   Recent Labs: 12/05/2013: BUN 8; Creatinine 0.75; Potassium 4.0; Sodium 139 12/19/2013: Hemoglobin 11.3*;  Platelets 278    Lipid Panel No results found for: CHOL, TRIG, HDL, CHOLHDL, VLDL, LDLCALC, LDLDIRECT    Wt Readings from Last 3 Encounters:  03/01/14 260 lb 12.8 oz (118.298 kg)  12/17/13 265 lb (120.203 kg)  12/11/13 265 lb (120.203 kg)      Other studies Reviewed: Additional studies/ records that were reviewed today include:  Cardiac cath notes, angiograms Review of the above records demonstrates:  Normal coronaries    ASSESSMENT AND PLAN:  1. Hypertension - BP is well controlled continue current meds.  2. History of obesity-status post gastric bypass 3. Chest pain - no further episodes of CP,  Cardiac cath was normal.  4. Supraventricular tachycardia   Current medicines are reviewed at length with the patient today.  The patient does not have concerns regarding medicines.  The following changes have been made:  no change   Disposition:   FU with me in  1 year    Signed, Shawntina Diffee, Deloris PingPhilip J, MD  03/01/2014 11:02 AM    [facility] [address], [place], [place]  [address] Phone: [phone]; Fax: [phone]

## 2014-03-01 NOTE — Patient Instructions (Signed)
Your physician recommends that you continue on your current medications as directed. Please refer to the Current Medication list given to you today.  Your physician wants you to follow-up in: 1 year with Dr. Nahser.  You will receive a reminder letter in the mail two months in advance. If you don't receive a letter, please call our office to schedule the follow-up appointment.  

## 2014-03-14 DIAGNOSIS — A4902 Methicillin resistant Staphylococcus aureus infection, unspecified site: Secondary | ICD-10-CM | POA: Diagnosis not present

## 2014-03-14 DIAGNOSIS — S31109S Unspecified open wound of abdominal wall, unspecified quadrant without penetration into peritoneal cavity, sequela: Secondary | ICD-10-CM | POA: Diagnosis not present

## 2014-03-14 DIAGNOSIS — T8189XS Other complications of procedures, not elsewhere classified, sequela: Secondary | ICD-10-CM | POA: Diagnosis not present

## 2014-03-29 ENCOUNTER — Encounter (HOSPITAL_BASED_OUTPATIENT_CLINIC_OR_DEPARTMENT_OTHER): Payer: BC Managed Care – PPO | Attending: Internal Medicine

## 2014-03-29 DIAGNOSIS — T814XXD Infection following a procedure, subsequent encounter: Secondary | ICD-10-CM | POA: Diagnosis not present

## 2014-03-29 DIAGNOSIS — S31109D Unspecified open wound of abdominal wall, unspecified quadrant without penetration into peritoneal cavity, subsequent encounter: Secondary | ICD-10-CM | POA: Diagnosis not present

## 2014-03-29 DIAGNOSIS — Y838 Other surgical procedures as the cause of abnormal reaction of the patient, or of later complication, without mention of misadventure at the time of the procedure: Secondary | ICD-10-CM | POA: Diagnosis not present

## 2014-03-29 DIAGNOSIS — Z90710 Acquired absence of both cervix and uterus: Secondary | ICD-10-CM | POA: Insufficient documentation

## 2014-04-12 DIAGNOSIS — Z90710 Acquired absence of both cervix and uterus: Secondary | ICD-10-CM | POA: Diagnosis not present

## 2014-04-12 DIAGNOSIS — T814XXD Infection following a procedure, subsequent encounter: Secondary | ICD-10-CM | POA: Diagnosis not present

## 2014-04-12 DIAGNOSIS — S31109D Unspecified open wound of abdominal wall, unspecified quadrant without penetration into peritoneal cavity, subsequent encounter: Secondary | ICD-10-CM | POA: Diagnosis not present

## 2014-04-29 ENCOUNTER — Encounter (HOSPITAL_COMMUNITY): Payer: Self-pay | Admitting: Emergency Medicine

## 2014-04-29 ENCOUNTER — Emergency Department (HOSPITAL_COMMUNITY): Payer: BC Managed Care – PPO

## 2014-04-29 ENCOUNTER — Emergency Department (HOSPITAL_COMMUNITY)
Admission: EM | Admit: 2014-04-29 | Discharge: 2014-04-29 | Disposition: A | Discharge status: Home / Self Care | Payer: BC Managed Care – PPO | Attending: Emergency Medicine | Admitting: Emergency Medicine

## 2014-04-29 DIAGNOSIS — S3991XA Unspecified injury of abdomen, initial encounter: Secondary | ICD-10-CM | POA: Insufficient documentation

## 2014-04-29 DIAGNOSIS — K5900 Constipation, unspecified: Secondary | ICD-10-CM | POA: Diagnosis not present

## 2014-04-29 DIAGNOSIS — R109 Unspecified abdominal pain: Secondary | ICD-10-CM

## 2014-04-29 DIAGNOSIS — Z79899 Other long term (current) drug therapy: Secondary | ICD-10-CM | POA: Insufficient documentation

## 2014-04-29 DIAGNOSIS — Y9389 Activity, other specified: Secondary | ICD-10-CM | POA: Diagnosis not present

## 2014-04-29 DIAGNOSIS — D649 Anemia, unspecified: Secondary | ICD-10-CM | POA: Insufficient documentation

## 2014-04-29 DIAGNOSIS — I471 Supraventricular tachycardia: Secondary | ICD-10-CM | POA: Insufficient documentation

## 2014-04-29 DIAGNOSIS — Y998 Other external cause status: Secondary | ICD-10-CM | POA: Insufficient documentation

## 2014-04-29 DIAGNOSIS — I10 Essential (primary) hypertension: Secondary | ICD-10-CM | POA: Insufficient documentation

## 2014-04-29 DIAGNOSIS — Y92410 Unspecified street and highway as the place of occurrence of the external cause: Secondary | ICD-10-CM | POA: Diagnosis not present

## 2014-04-29 LAB — I-STAT CHEM 8, ED
BUN: 6 mg/dL (ref 6–23)
CREATININE: 0.9 mg/dL (ref 0.50–1.10)
Calcium, Ion: 1.07 mmol/L — ABNORMAL LOW (ref 1.12–1.23)
Chloride: 102 mmol/L (ref 96–112)
GLUCOSE: 89 mg/dL (ref 70–99)
HCT: 39 % (ref 36.0–46.0)
HEMOGLOBIN: 13.3 g/dL (ref 12.0–15.0)
Potassium: 4.7 mmol/L (ref 3.5–5.1)
Sodium: 139 mmol/L (ref 135–145)
TCO2: 26 mmol/L (ref 0–100)

## 2014-04-29 MED ORDER — ONDANSETRON HCL 4 MG/2ML IJ SOLN
4.0000 mg | Freq: Once | INTRAMUSCULAR | Status: AC
  Administered 2014-04-29: 4 mg via INTRAVENOUS
  Filled 2014-04-29: qty 2

## 2014-04-29 MED ORDER — TRAMADOL HCL 50 MG PO TABS: ORAL_TABLET | ORAL | Status: DC

## 2014-04-29 MED ORDER — ACETAMINOPHEN 325 MG PO TABS
650.0000 mg | ORAL_TABLET | Freq: Once | ORAL | Status: AC
  Administered 2014-04-29: 650 mg via ORAL
  Filled 2014-04-29: qty 2

## 2014-04-29 MED ORDER — NAPROXEN 500 MG PO TABS: 500.0000 mg | ORAL_TABLET | Freq: Two times a day (BID) | ORAL | Status: DC

## 2014-04-29 MED ORDER — IOHEXOL 300 MG/ML  SOLN
100.0000 mL | Freq: Once | INTRAMUSCULAR | Status: AC | PRN
  Administered 2014-04-29: 100 mL via INTRAVENOUS
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
## 2014-04-29 NOTE — Discharge Instructions (Signed)

## 2014-08-20 ENCOUNTER — Other Ambulatory Visit: Payer: Self-pay

## 2014-08-20 DIAGNOSIS — Z1231 Encounter for screening mammogram for malignant neoplasm of breast: Secondary | ICD-10-CM

## 2014-09-06 ENCOUNTER — Ambulatory Visit
Admission: RE | Admit: 2014-09-06 | Discharge: 2014-09-06 | Disposition: A | Discharge status: Home / Self Care | Payer: BC Managed Care – PPO | Source: Ambulatory Visit

## 2014-09-06 DIAGNOSIS — Z1231 Encounter for screening mammogram for malignant neoplasm of breast: Secondary | ICD-10-CM

## 2014-09-09 ENCOUNTER — Other Ambulatory Visit: Payer: Self-pay | Admitting: Obstetrics and Gynecology

## 2014-09-09 DIAGNOSIS — R928 Other abnormal and inconclusive findings on diagnostic imaging of breast: Secondary | ICD-10-CM

## 2014-09-20 ENCOUNTER — Ambulatory Visit
Admission: RE | Admit: 2014-09-20 | Discharge: 2014-09-20 | Disposition: A | Discharge status: Home / Self Care | Payer: BC Managed Care – PPO | Source: Ambulatory Visit | Attending: Obstetrics and Gynecology | Admitting: Obstetrics and Gynecology

## 2014-09-20 DIAGNOSIS — R928 Other abnormal and inconclusive findings on diagnostic imaging of breast: Secondary | ICD-10-CM

## 2015-02-17 ENCOUNTER — Other Ambulatory Visit: Payer: Self-pay | Admitting: Obstetrics and Gynecology

## 2015-02-17 DIAGNOSIS — N6001 Solitary cyst of right breast: Secondary | ICD-10-CM

## 2015-04-14 ENCOUNTER — Ambulatory Visit
Admission: RE | Admit: 2015-04-14 | Discharge: 2015-04-14 | Disposition: A | Discharge status: Home / Self Care | Payer: BC Managed Care – PPO | Source: Ambulatory Visit | Attending: Obstetrics and Gynecology | Admitting: Obstetrics and Gynecology

## 2015-04-14 DIAGNOSIS — N6001 Solitary cyst of right breast: Secondary | ICD-10-CM

## 2015-04-15 ENCOUNTER — Ambulatory Visit: Payer: BC Managed Care – PPO | Admitting: Cardiology

## 2015-04-16 ENCOUNTER — Ambulatory Visit (INDEPENDENT_AMBULATORY_CARE_PROVIDER_SITE_OTHER): Payer: BC Managed Care – PPO | Admitting: Physician Assistant

## 2015-04-16 ENCOUNTER — Encounter: Payer: Self-pay | Admitting: Physician Assistant

## 2015-04-16 ENCOUNTER — Other Ambulatory Visit: Payer: Self-pay | Admitting: Orthopedic Surgery

## 2015-04-16 VITALS — BP 116/82 | HR 82 | Ht 65.0 in | Wt 271.8 lb

## 2015-04-16 DIAGNOSIS — R0789 Other chest pain: Secondary | ICD-10-CM

## 2015-04-16 DIAGNOSIS — M1731 Unilateral post-traumatic osteoarthritis, right knee: Secondary | ICD-10-CM

## 2015-04-16 MED ORDER — METOPROLOL SUCCINATE ER 25 MG PO TB24: 25.0000 mg | ORAL_TABLET | Freq: Every day | ORAL | Status: DC

## 2015-04-16 NOTE — Progress Notes (Signed)
Cardiology Office Note   Date:  04/16/2015   ID:  Shirley Morris, DOB May 15, 1970, MRN [id number]  PCP:  Johny Blamer, MD  Cardiologist:  Dr. Elease Hashimoto  Chief Complaint  Patient presents with  . Follow-up    seen for Dr. Elease Hashimoto      History of Present Illness: Shirley Morris is a 45 y.o. female who presents for 1 year office visit. She has a history of SVT, history of obesity s/p gastric bypass, hypertension, history of chest pain with negative stress test in 02/2009. As part of her preoperative clearance for her hysterectomy in 2015, she underwent 2 day Myoview which came back low risk but did reveal possible mild anterior ischemia, however subsequent cath performed on 12/11/2013 showed a normal coronaries. She was last seen in the clinic on 03/01/2014, at which time, she was doing well. One year follow-up was recommended.  She presents today for 1 year follow-up. Since last weekend, she has had several episodes of substernal sharp chest pressure both of which are occurring at rest and lasting about 7 minutes before resolving. She denies any exacerbating or alleviating factors. She has never experienced this type of symptom before. She denies any shortness of breath. She did dislocate her right knee a few weeks ago and has trouble ambulating. She denies history of blood clots nor does she have significant ipsilateral swelling. Since her hysterectomy and later knee problem, she has been on meloxicam for more than a year now. She has gained roughly 11 pounds since her last serious visit, and has been asking about potentially go to weight loss clinic and get phentermine.     Past Medical History  Diagnosis Date  . SVT (supraventricular tachycardia)   . Hypertension   . Migraine   . Palpitations   . Chest pain     Negative stress echo in February of 2011  . Tear of meniscus of right knee   . Injury of thumb, right   . Cesarean delivery delivered     for fetal distress    . Family history of colon cancer   . Constipation   . Anemia     Past Surgical History  Procedure Laterality Date  . Knee arthroscopy  2011  . US echocardiography  11/26/2008    EF 55-60%  . 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 Cesarean section    . Laparoscopic assisted vaginal hysterectomy Right 12/17/2013    Procedure: LAPAROSCOPIC ASSISTED VAGINAL HYSTERECTOMY WITH RIGHT SALPINGO OOPHERECTOMY ;  Surgeon: Hal Morales, MD;  Location: [facility];  Service: Gynecology;  Laterality: Right;  . Left heart catheterization with coronary angiogram N/A 12/11/2013    Procedure: LEFT HEART CATHETERIZATION WITH CORONARY ANGIOGRAM;  Surgeon: Peter M Swaziland, MD;  Location: [facility] CATH LAB;  Service: Cardiovascular;  Laterality: N/A;     Current Outpatient Prescriptions  Medication Sig Dispense Refill  . acetaminophen (TYLENOL) 160 MG/5ML solution Take 30.5 mLs (975 mg total) by mouth every 6 (six) hours. Take 975 mg arouhnd the clock every 6 hours for 3 more days, then as needed for pain. (Patient not taking: Reported on 04/29/2014) 480 mL 3  . calcium citrate (CALCITRATE - DOSED IN MG ELEMENTAL CALCIUM) 950 MG tablet Take 1 tablet by mouth daily.      . Cholecalciferol (VITAMIN D PO) Take 1 tablet by mouth daily.     . famotidine (PEPCID) 20 MG tablet Take 1 tablet (20 mg total) by mouth 2 (  two) times daily as needed for heartburn or indigestion. (Patient not taking: Reported on 04/29/2014) 30 tablet 0  . ferrous sulfate 325 (65 FE) MG tablet Take 325 mg by mouth daily with breakfast.    . fluocinonide cream (LIDEX) 0.05 % Apply 1 application topically daily as needed. Eczema  0  . imipramine (TOFRANIL) 25 MG tablet Take 75 mg by mouth at bedtime.     . metoprolol succinate (TOPROL XL) 25 MG 24 hr tablet Take 1 tablet (25 mg total) by mouth daily. 90 tablet 3  . Multiple Vitamin (MULTIVITAMIN) tablet Take 1 tablet by mouth daily.      . naproxen (NAPROSYN) 500 MG tablet Take 1 tablet (500 mg  total) by mouth 2 (two) times daily. 30 tablet 0  . ondansetron (ZOFRAN) 4 MG tablet Take 1 tablet (4 mg total) by mouth every 8 (eight) hours as needed for nausea or vomiting. (Patient not taking: Reported on 04/29/2014) 20 tablet 0  . oxyCODONE-acetaminophen (PERCOCET/ROXICET) 5-325 MG per tablet Take by mouth daily as needed. FOR PAIN  0  . traMADol (ULTRAM) 50 MG tablet 1  po  every 6 hours as needed for pain 12 tablet 0  . triamterene-hydrochlorothiazide (MAXZIDE-25) 37.5-25 MG per tablet Take 0.5 tablets by mouth daily.       No current facility-administered medications for this visit.    Allergies:   Review of patient's allergies indicates no known allergies.    Social History:  The patient  reports that she has never smoked. She has never used smokeless tobacco. She reports that she does not drink alcohol or use illicit drugs.   Family History:  The patient's family history includes Colon cancer in her cousin and maternal uncle; Coronary artery disease in her father; Diabetes in her father; Hypertension in her father.    ROS:  Please see the history of present illness.   Otherwise, review of systems are positive for occasional chest pain.   All other systems are reviewed and negative.    PHYSICAL EXAM: VS:  There were no vitals taken for this visit. , BMI There is no weight on file to calculate BMI. GEN: Well nourished, well developed, in no acute distress HEENT: normal Neck: no JVD, carotid bruits, or masses Cardiac: RRR; no murmurs, rubs, or gallops,no edema  Respiratory:  clear to auscultation bilaterally, normal work of breathing GI: soft, nontender, nondistended, + BS MS: no deformity or atrophy Skin: warm and dry, no rash Neuro:  Strength and sensation are intact Psych: euthymic mood, full affect   EKG:  EKG is ordered today. The ekg ordered today demonstrates NSR without significant ST-T wave changes   Recent Labs: 04/29/2014: BUN 6; Creatinine, Ser 0.90; Hemoglobin  13.3; Potassium 4.7; Sodium 139    Lipid Panel No results found for: CHOL, TRIG, HDL, CHOLHDL, VLDL, LDLCALC, LDLDIRECT    Wt Readings from Last 3 Encounters:  03/01/14 260 lb 12.8 oz (118.298 kg)  12/17/13 265 lb (120.203 kg)  12/11/13 265 lb (120.203 kg)      Other studies Reviewed: Additional studies/ records that were reviewed today include:   Myoview 12/07/2013 Impression Exercise Capacity: Lexiscan with no exercise. BP Response: Normal blood pressure response. Clinical Symptoms: There is dyspnea. ECG Impression: No significant ST segment change suggestive of ischemia. Comparison with Prior Nuclear Study: No images to compare  Overall Impression: Low risk stress nuclear study with a small, mild intensity, partially reversible anterior defect consistent with soft tissue attenuation and mild anterior  ischemia; defect may also be related to shifting breast attenuation.  LV Wall Motion: NL LV Function; NL Wall Motion   Cardiac cath 12/11/2013 Coronary angiography: Coronary dominance: right  Left mainstem: Normal  Left anterior descending (LAD): Normal  Left circumflex (LCx): Normal  Right coronary artery (RCA): Normal  Left ventriculography: Left ventricular systolic function is normal, LVEF is estimated at 55-65%, there is no significant mitral regurgitation   Final Conclusions:  1. Normal coronary anatomy 2. Normal LV function.  Recommendations: risk factor modification. She is cleared from a cardiac standpoint for GYN surgery.   Review of the above records demonstrates:      ASSESSMENT AND PLAN:  1.  Chest pain   - both episodes occurred at rest, her ambulatory status is limited by her R knee problem, however cath on 11/2013 showed clean coronaries. Doubt cardiac etiology, would recommend monitoring for now, however if continue to have symptom or if symptom increase, may consider lexiscan myoview (however would like to avoid as her obesity make  false positive test likely)  2. SVT: no recurrence of SVT, well controlled on metoprolol XL, refill today  3. history of obesity s/p gastric bypass, she did gain another 11 lbs since last year, encourage diet and exercise. We discussed potential side effect of phentermine  4. Hypertension: well controlled on metoprolol and maxide   Current medicines are reviewed at length with the patient today.  The patient does not have concerns regarding medicines.  The following changes have been made:  no change  Labs/ tests ordered today include:  No orders of the defined types were placed in this encounter.     Disposition:   FU with Dr. Elease Hashimoto in 1 year  Signed, Azalee Course, [place]  04/16/2015 12:59 PM    [facility] [address] [place], [place], [place]  [address] Phone: [phone]; Fax: [phone]

## 2015-04-16 NOTE — Patient Instructions (Signed)
Your physician wants you to follow-up in: 1 Year. You will receive a reminder letter in the mail two months in advance. If you don't receive a letter, please call our office to schedule the follow-up appointment.  

## 2015-04-18 ENCOUNTER — Other Ambulatory Visit: Payer: BC Managed Care – PPO

## 2015-04-21 ENCOUNTER — Other Ambulatory Visit: Payer: BC Managed Care – PPO

## 2018-03-03 ENCOUNTER — Encounter: Payer: Self-pay | Admitting: Physician Assistant

## 2018-03-17 ENCOUNTER — Ambulatory Visit: Payer: BC Managed Care – PPO | Admitting: Cardiovascular Disease

## 2018-03-22 ENCOUNTER — Encounter

## 2018-03-22 ENCOUNTER — Ambulatory Visit: Payer: BC Managed Care – PPO | Admitting: Physician Assistant

## 2018-03-23 ENCOUNTER — Ambulatory Visit: Payer: BC Managed Care – PPO | Admitting: Cardiovascular Disease

## 2018-03-23 ENCOUNTER — Encounter: Payer: Self-pay | Admitting: Cardiovascular Disease

## 2018-03-23 ENCOUNTER — Other Ambulatory Visit: Payer: Self-pay | Admitting: *Deleted

## 2018-03-23 VITALS — BP 124/74 | HR 82 | Ht 65.0 in | Wt 286.4 lb

## 2018-03-23 DIAGNOSIS — I119 Hypertensive heart disease without heart failure: Secondary | ICD-10-CM | POA: Diagnosis not present

## 2018-03-23 DIAGNOSIS — R600 Localized edema: Secondary | ICD-10-CM | POA: Diagnosis not present

## 2018-03-23 NOTE — Progress Notes (Signed)
Cardiology Office Note   Date:  03/23/2018   ID:  Shirley Morris, DOB March 20, 1970, MRN [id number]  PCP:  Autumn Patty, MD  Cardiologist:   Kristeen Miss, MD   Chief Complaint  Patient presents with  . Chest Pain    1. Hypertension 2. History of obesity-status post gastric bypass 3. Chest pain 4. Supraventricular tachycardia    Shirley Morris is a 48 y.o. female who presents for follow up of her HTN  she had  GYN surgery . Her pre-op myoview showed some abnormalities but the subsequent cardiac cath  was completely normal.   She had a hysterectomy and unfortunate had some healing complications  related to that. She still getting wound care.  BP and heart rate have remained normal.  March 23, 2018:  Patient is seen today for follow-up of her retention and history of chest discomfort. Lives in [place] now Has had some leg swelling  No CP , breathing is ok Has DOE climbing stairs.    Her last stress test was in November, 2015 which revealed a very mild anterior defect likely due to breast attenuation.   She was needing to have a partial hysterectomy so we proceed with heart catheterization.  The heart cath revealed normal coronary arteries.  She has normal left ventricular systolic function.   Has leg edema - especially at the end of the day  We discussed the Lounge Dr. Leg rest    Past Medical History:  Diagnosis Date  . Anemia   . Cesarean delivery delivered    for fetal distress  . Chest pain    Negative stress echo in February of 2011  . Constipation   . Family history of colon cancer   . Hypertension   . Injury of thumb, right   . Migraine   . Palpitations   . SVT (supraventricular tachycardia) (HCC)   . Tear of meniscus of right knee     Past Surgical History:  Procedure Laterality Date  . CESAREAN SECTION    . 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 KNEE ARTHROSCOPY  2011  . LAPAROSCOPIC ASSISTED VAGINAL HYSTERECTOMY  Right 12/17/2013   Procedure: LAPAROSCOPIC ASSISTED VAGINAL HYSTERECTOMY WITH RIGHT SALPINGO OOPHERECTOMY ;  Surgeon: Hal Morales, MD;  Location: [facility];  Service: Gynecology;  Laterality: Right;  . LEFT HEART CATHETERIZATION WITH CORONARY ANGIOGRAM N/A 12/11/2013   Procedure: LEFT HEART CATHETERIZATION WITH CORONARY ANGIOGRAM;  Surgeon: Peter M Swaziland, MD;  [facility] C[facility] LAB;  Service: Cardiovascular;  Laterality: N/A;  . US ECHOCARDIOGRAPHY  11/26/2008   EF 55-60%     Current Outpatient Medications  Medication Sig Dispense Refill  . benzonatate (TESSALON) 100 MG capsule Take 100 mg by mouth 3 (three) times daily.     . calcium citrate (CALCITRATE - DOSED IN MG ELEMENTAL CALCIUM) 950 MG tablet Take 1 tablet by mouth daily.      . cetirizine (ZYRTEC) 10 MG tablet Take 10 mg by mouth daily.     Marland Kitchen estradiol (VIVELLE-DOT) 0.05 MG/24HR patch     . fluocinonide cream (LIDEX) 0.05 % Apply 1 application topically daily as needed. Eczema  0  . imipramine (TOFRANIL) 25 MG tablet Take 75 mg by mouth at bedtime.     Marland Kitchen L-LYSINE PO Take 1 tablet by mouth.     . meloxicam (MOBIC) 15 MG tablet Take 1 tablet by mouth daily.    . metoprolol succinate (TOPROL XL) 25 MG 24 hr  tablet Take 1 tablet (25 mg total) by mouth daily. 90 tablet 3  . Multiple Vitamin (MULTIVITAMIN) tablet Take 1 tablet by mouth daily.      Marland Kitchen tiZANidine (ZANAFLEX) 4 MG tablet TAKE 1 TABLET (4 MG TOTAL) BY MOUTH EVERY SIX (6) HOURS AS NEEDED.    Marland Kitchen triamterene-hydrochlorothiazide (MAXZIDE-25) 37.5-25 MG per tablet Take 0.5 tablets by mouth daily.      Marland Kitchen UNABLE TO FIND Take 500 mg by mouth daily. Tumeric    . Vitamin D, Ergocalciferol, (DRISDOL) 1.25 MG (50000 UT) CAPS capsule Take 50,000 Units by mouth every 7 (seven) days.     . Ascorbic Acid (VITAMIN C) 500 MG CAPS Take 1 capsule by mouth daily.     No current facility-administered medications for this visit.     Allergies:   Penicillins    Social History:  The patient   reports that she has never smoked. She has never used smokeless tobacco. She reports that she does not drink alcohol or use drugs.   Family History:  The patient's family history includes Colon cancer in her cousin and maternal uncle; Coronary artery disease in her father; Diabetes in her father; Hypertension in her father.    ROS:  Please see the history of present illness.   Physical Exam: Blood pressure 124/74, pulse 82, height 5\' 5"  (1.651 m), weight 286 lb 6.4 oz (129.9 kg), SpO2 97 %.  GEN:   morbiidly obese female,  No acute distress  HEENT: Normal NECK: No JVD; No carotid bruits LYMPHATICS: No lymphadenopathy CARDIAC: RRR   RESPIRATORY:  Clear to auscultation without rales, wheezing or rhonchi  ABDOMEN: Soft, non-tender, non-distended MUSCULOSKELETAL:  No edema; No deformity  SKIN: Warm and dry NEUROLOGIC:  Alert and oriented x 3     EKG:   March 23, 2018: Normal sinus rhythm at 66 beats a minute.  Normal EKG.   Recent Labs: No results found for requested labs within last 8760 hours.    Lipid Panel No results found for: CHOL, TRIG, HDL, CHOLHDL, VLDL, LDLCALC, LDLDIRECT    Wt Readings from Last 3 Encounters:  03/23/18 286 lb 6.4 oz (129.9 kg)  04/16/15 271 lb 12.8 oz (123.3 kg)  03/01/14 260 lb 12.8 oz (118.3 kg)      Other studies Reviewed: Additional studies/ records that were reviewed today include:  Cardiac cath notes, angiograms Review of the above records demonstrates:  Normal coronaries    ASSESSMENT AND PLAN:  1. Hypertension -   blood pressure is well controlled.  She current takes a low-dose of Maxide.  She inquired if she would be able to take a whole Maxide tablet on occasion. Told her I think this would be okay.  If she decides to take it on a regular basis, will need to have her check her basic metabolic profile and blood pressure several weeks after that.  She now lives in [place].  She may check in with her primary medical doctor to  make this determination.   2. History of obesity  - advised her to work on weight loss    3. Chest pain -    4.  Leg edema: She has longstanding hypertension.  She presents with some leg edema by history.  She took pictures and showed me pictures of her legs at the end of the day.  She does have significant edema on occasion. Do an echocardiogram.  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  The patient does not have concerns regarding medicines.  The following changes have been made:  no change   Disposition:   FU with me in 1 year    Signed, Kristeen Miss, MD  03/23/2018 3:42 PM    [facility] [address] [place], [place], [place]  [address] Phone: [phone]; Fax: [phone]

## 2018-03-23 NOTE — Patient Instructions (Addendum)
Medication Instructions:  Your physician recommends that you continue on your current medications as directed. Please refer to the Current Medication list given to you today.  If you need a refill on your cardiac medications before your next appointment, please call your pharmacy.   Lab work: TODAY - basic metabolic panel  If you have labs (blood work) drawn today and your tests are completely normal, you will receive your results only by: Marland Kitchen MyChart Message (if you have MyChart) OR . A paper copy in the mail If you have any lab test that is abnormal or we need to change your treatment, we will call you to review the results.  Testing/Procedures: Your physician has requested that you have an echocardiogram. Echocardiography is a painless test that uses sound waves to create images of your heart. It provides your doctor with information about the size and shape of your heart and how well your heart's chambers and valves are working. This procedure takes approximately one hour. There are no restrictions for this procedure.    Follow-Up: At [facility], you and your health needs are our priority.  As part of our continuing mission to provide you with exceptional heart care, we have created designated Provider Care Teams.  These Care Teams include your primary Cardiologist (physician) and Advanced Practice Providers (APPs -  Physician Assistants and Nurse Practitioners) who all work together to provide you with the care you need, when you need it. You will need a follow up appointment in:  6 months.  Please call our office 2 months in advance to schedule this appointment.  You may see Kristeen Miss, MD or one of the following Advanced Practice Providers on your designated Care Team: Tereso Newcomer, PA-C [place], [place] . Berton Bon, NP   For your  leg edema you  should do  the following 1. Leg elevation - I recommend the Lounge Dr. Leg rest.  See below for details  2. Salt restriction  -   Use potassium chloride instead of regular salt as a salt substitute. 3. Walk regularly 4. Compression hose - [facility] [employer] [facility] 5. Weight loss    Available on [email or web address] Or  Go to [email or web address]

## 2018-03-24 LAB — BASIC METABOLIC PANEL
BUN/Creatinine Ratio: 12 (ref 9–23)
BUN: 12 mg/dL (ref 6–24)
CO2: 27 mmol/L (ref 20–29)
Calcium: 9.5 mg/dL (ref 8.7–10.2)
Chloride: 100 mmol/L (ref 96–106)
Creatinine, Ser: 0.97 mg/dL (ref 0.57–1.00)
GFR calc Af Amer: 80 mL/min/{1.73_m2} (ref >59)
GFR calc non Af Amer: 70 mL/min/{1.73_m2} (ref >59)
GLUCOSE: 80 mg/dL (ref 65–99)
Potassium: 3.8 mmol/L (ref 3.5–5.2)
Sodium: 141 mmol/L (ref 134–144)

## 2018-04-03 ENCOUNTER — Ambulatory Visit (HOSPITAL_COMMUNITY): Payer: BC Managed Care – PPO | Attending: Cardiology

## 2018-04-03 DIAGNOSIS — R600 Localized edema: Secondary | ICD-10-CM | POA: Diagnosis not present

## 2018-04-03 DIAGNOSIS — I119 Hypertensive heart disease without heart failure: Secondary | ICD-10-CM | POA: Diagnosis not present

## 2018-04-10 ENCOUNTER — Other Ambulatory Visit (HOSPITAL_COMMUNITY): Payer: BC Managed Care – PPO

## 2019-01-17 ENCOUNTER — Ambulatory Visit: Payer: BC Managed Care – PPO | Admitting: Physician Assistant

## 2019-01-29 ENCOUNTER — Ambulatory Visit: Payer: BC Managed Care – PPO | Admitting: Cardiovascular Disease

## 2019-01-29 ENCOUNTER — Encounter: Payer: Self-pay | Admitting: Cardiovascular Disease

## 2019-01-29 ENCOUNTER — Other Ambulatory Visit: Payer: Self-pay

## 2019-01-29 VITALS — BP 114/80 | HR 83 | Ht 65.0 in | Wt 277.1 lb

## 2019-01-29 DIAGNOSIS — I119 Hypertensive heart disease without heart failure: Secondary | ICD-10-CM

## 2019-01-29 DIAGNOSIS — R0789 Other chest pain: Secondary | ICD-10-CM

## 2019-01-29 DIAGNOSIS — R600 Localized edema: Secondary | ICD-10-CM | POA: Diagnosis not present

## 2019-01-29 MED ORDER — METOPROLOL TARTRATE 50 MG PO TABS: ORAL_TABLET | ORAL | 0 refills | Status: DC

## 2019-01-29 NOTE — Progress Notes (Signed)
Cardiology Office Note   Date:  01/29/2019   ID:  Shirley Morris, DOB January 23, 1971, MRN [id number]  PCP:  Autumn Patty, MD  Cardiologist:   Kristeen Miss, MD   Chief Complaint  Patient presents with  . Chest Pain    1. Hypertension 2. History of obesity-status post gastric bypass 3. Chest pain 4. Supraventricular tachycardia    Shirley Morris is a 49 y.o. female who presents for follow up of her HTN  she had  GYN surgery . Her pre-op myoview showed some abnormalities but the subsequent cardiac cath  was completely normal.   She had a hysterectomy and unfortunate had some healing complications  related to that. She still getting wound care.  BP and heart rate have remained normal.  March 23, 2018:  Patient is seen today for follow-up of her retention and history of chest discomfort. [place] in [place] now Has had some leg swelling  No CP , breathing is ok Has DOE climbing stairs.    Her last stress test was in November, 2015 which revealed a very mild anterior defect likely due to breast attenuation.   She was needing to have a partial hysterectomy so we proceed with heart catheterization.  The heart cath  In 2015 revealed normal coronary arteries.  She has normal left ventricular systolic function.   Has leg edema - especially at the end of the day  We discussed the Lounge Dr. Danie Binder rest   Jan. 4, 2021  Shirley Morris is seen today for follow-up of her episodes of chest discomfort. Body mass index is 46.12 kg/m. Wt is 277 lbs.  Has lost 23 lbs since Oct, 2019  She is status post gastric bypass. She is she is had chest pain for years.  She had a normal heart catheterization in 2015.  She has had some atypical CP ,  Sharp pain. Lasted 3 minutes.   Left side of chest .  Work up with pain under her left breast Lasted for 15  Minutes.   Took EX tylenol  Is not getting much exercise.  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 Anemia   . Cesarean delivery delivered    for fetal distress  . Chest pain    Negative stress echo in February of 2011  . Constipation   . Family history of colon cancer   . Hypertension   . Injury of thumb, right   . Migraine   . Palpitations   . SVT (supraventricular tachycardia) (HCC)   . Tear of meniscus of right knee     Past Surgical History:  Procedure Laterality Date  . CESAREAN SECTION    . 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 KNEE ARTHROSCOPY  2011  . LAPAROSCOPIC ASSISTED VAGINAL HYSTERECTOMY Right 12/17/2013   Procedure: LAPAROSCOPIC ASSISTED VAGINAL HYSTERECTOMY WITH RIGHT SALPINGO OOPHERECTOMY ;  Surgeon: Hal Morales, MD;  Location: [facility];  Service: Gynecology;  Laterality: Right;  . LEFT HEART CATHETERIZATION WITH CORONARY ANGIOGRAM N/A 12/11/2013   Procedure: LEFT HEART CATHETERIZATION WITH CORONARY ANGIOGRAM;  Surgeon: Peter M Swaziland, MD;  Location: [facility] CATH LAB;  Service: Cardiovascular;  Laterality: N/A;  . US ECHOCARDIOGRAPHY  11/26/2008   EF 55-60%     Current Outpatient Medications  Medication Sig Dispense Refill  . Ascorbic Acid (VITAMIN C) 500 MG CAPS Take 1 capsule by mouth daily.    Marland Kitchen  calcium citrate (CALCITRATE - DOSED IN MG ELEMENTAL CALCIUM) 950 MG tablet Take 1 tablet by mouth daily.      . cetirizine (ZYRTEC) 10 MG tablet Take 10 mg by mouth daily.     Marland Kitchen estradiol (VIVELLE-DOT) 0.05 MG/24HR patch     . fluocinonide cream (LIDEX) 7.51 % Apply 1 application topically daily as needed. Eczema  0  . imipramine (TOFRANIL) 25 MG tablet Take 75 mg by mouth at bedtime.     Marland Kitchen L-LYSINE PO Take 1 tablet by mouth.     . Liraglutide -Weight Management (SAXENDA [place]) Inject 3 mg into the skin daily.    . meloxicam (MOBIC) 15 MG tablet Take 1 tablet by mouth daily.    . metoprolol succinate (TOPROL XL) 25 MG 24 hr tablet Take 1 tablet (25 mg total) by mouth daily. 90 tablet 3  . Multiple Vitamin  (MULTIVITAMIN) tablet Take 1 tablet by mouth daily.      Marland Kitchen tiZANidine (ZANAFLEX) 4 MG tablet TAKE 1 TABLET (4 MG TOTAL) BY MOUTH EVERY SIX (6) HOURS AS NEEDED.    Marland Kitchen triamterene-hydrochlorothiazide (MAXZIDE-25) 37.5-25 MG per tablet Take 0.5 tablets by mouth daily.      Marland Kitchen UNABLE TO FIND Take 500 mg by mouth daily. Tumeric    . Vitamin D, Ergocalciferol, (DRISDOL) 1.25 MG (50000 UT) CAPS capsule Take 50,000 Units by mouth every 7 (seven) days.     . metoprolol tartrate (LOPRESSOR) 50 MG tablet Take 1 pill 2 hours before your CT in addition to your regular dose of Toprol (metoprolol succinate) 1 tablet 0   No current facility-administered medications for this visit.    Allergies:   Penicillins    Social History:  The patient  reports that she has never smoked. She has never used smokeless tobacco. She reports that she does not drink alcohol or use drugs.   Family History:  The patient's family history includes Colon cancer in her cousin and maternal uncle; Coronary artery disease in her father; Diabetes in her father; Hypertension in her father.    ROS:  Please see the history of present illness.   Physical Exam: Blood pressure 114/80, pulse 83, height 5\' 5"  (1.651 m), weight 277 lb 1.9 oz (125.7 kg), SpO2 96 %.  GEN:  Middle age, obese,female  HEENT: Normal NECK: No JVD; No carotid bruits LYMPHATICS: No lymphadenopathy CARDIAC: RRR , no murmurs, rubs, gallops RESPIRATORY:  Clear to auscultation without rales, wheezing or rhonchi  ABDOMEN: Soft, non-tender, non-distended MUSCULOSKELETAL:  No edema; No deformity  SKIN: Warm and dry NEUROLOGIC:  Alert and oriented x 3   EKG:   January 29, 2019: Normal sinus rhythm at 83.  No ST or T wave changes.  Recent Labs: 03/23/2018: BUN 12; Creatinine, Ser 0.97; Potassium 3.8; Sodium 141    Lipid Panel No results found for: CHOL, TRIG, HDL, CHOLHDL, VLDL, LDLCALC, LDLDIRECT    Wt Readings from Last 3 Encounters:  01/29/19 277 lb 1.9 oz  (125.7 kg)  03/23/18 286 lb 6.4 oz (129.9 kg)  04/16/15 271 lb 12.8 oz (123.3 kg)      Other studies Reviewed: Additional studies/ records that were reviewed today include:  Cardiac cath notes, angiograms Review of the above records demonstrates:  Normal coronaries    ASSESSMENT AND PLAN:  1. Hypertension -   blood pressure is currently well controlled.  I encouraged her to continue with a good weight loss program..  2. History of obesity  -she is on medicines  for weight loss.  She is also had gastric bypass.  She will continue to work on diet and exercise.  I have reviewed with her a good exercise program.  3. Chest pain -she continues to have episodes of chest pain.  Some episodes are clearly atypical.  She is had several episodes of deep pressure under her left breast which last for 15 to 20 minutes.  She has a family history of coronary artery disease.  Given her weight I do not think that she is a good candidate for Myoview study.  We will schedule her for a coronary CT angiogram.  4.  Leg edema: No significant leg edema today.  Current medicines are reviewed at length with the patient today.  The patient does not have concerns regarding medicines.  The following changes have been made:  no change   Disposition:   FU with me in 1 year    Signed, Kristeen Miss, MD  01/29/2019 9:29 AM    [facility] [address] [place], [place], [place]  [address] Phone: [phone]; Fax: [phone]

## 2019-01-29 NOTE — Patient Instructions (Addendum)
Medication Instructions:  Your physician recommends that you continue on your current medications as directed. Please refer to the Current Medication list given to you today.  *If you need a refill on your cardiac medications before your next appointment, please call your pharmacy*  Lab Work: Your physician recommends that you return for lab work in: 1 week before your Coronary CT   Testing/Procedures: Your cardiac CT will be scheduled at one of the below locations:   [facility] [address] [place], [place] [address] [phone]  OR  [facility] [address] [place], [place] [address] [phone]  If scheduled at [facility], please arrive at the [facility] main entrance of [facility] 30-45 minutes prior to test start time. Proceed to the [facility] Radiology Department (first floor) to check-in and test prep.  If scheduled at [facility], please arrive 15 mins early for check-in and test prep.  Please follow these instructions carefully (unless otherwise directed):  Hold all erectile dysfunction medications at least 3 days (72 hrs) prior to test.  On the Night Before the Test: . Be sure to Drink plenty of water. . Do not consume any caffeinated/decaffeinated beverages or chocolate 12 hours prior to your test. . Do not take any antihistamines 12 hours prior to your test. . If the patient has contrast allergy: ? Patient will need a prescription for Prednisone and very clear instructions (as follows): 1. Prednisone 50 mg - take 13 hours prior to test 2. Take another Prednisone 50 mg 7 hours prior to test 3. Take another Prednisone 50 mg 1 hour prior to test 4. Take Benadryl 50 mg 1 hour prior to test . Patient must complete all four doses of above prophylactic medications. . Patient will need a ride after test due to Benadryl.  On the Day of the Test: . Drink  plenty of water. Do not drink any water within one hour of the test. . Do not eat any food 4 hours prior to the test. . You may take your regular medications prior to the test.  . Take metoprolol (Lopressor) 50 mg two hours prior to test (In addition to your daily Toprol) . HOLD Hydrochlorothiazide (Maxzide) morning of the test. . FEMALES- please wear underwire-free bra if available        After the Test: . Drink plenty of water. . After receiving IV contrast, you may experience a mild flushed feeling. This is normal. . On occasion, you may experience a mild rash up to 24 hours after the test. This is not dangerous. If this occurs, you can take Benadryl 25 mg and increase your fluid intake. . If you experience trouble breathing, this can be serious. If it is severe call 911 IMMEDIATELY. If it is mild, please call our office. . If you take any of these medications: Glipizide/Metformin, Avandament, Glucavance, please do not take 48 hours after completing test unless otherwise instructed.   Once we have confirmed authorization from your insurance company, we will call you to set up a date and time for your test.   For non-scheduling related questions, please contact the cardiac imaging nurse navigator should you have any questions/concerns: Rockwell Alexandria, RN Navigator Cardiac Imaging [facility] [phone] Office    Follow-Up: At [facility], you and your health needs are our priority.  As part of our continuing mission to provide you with exceptional heart care, we  have created designated Provider Care Teams.  These Care Teams include your primary Cardiologist (physician) and Advanced Practice Providers (APPs -  Physician Assistants and Nurse Practitioners) who all work together to provide you with the care you need, when you need it.  Your next appointment:   1 year(s)  The format for your next appointment:   In Person  Provider:   You may see Mertie Moores, MD or one of the following Advanced Practice Providers on your designated Care Team:    Richardson Dopp, PA-C  [place], [place]  Daune Perch, [place]

## 2019-02-13 ENCOUNTER — Other Ambulatory Visit: Payer: Self-pay | Admitting: Nurse Practitioner

## 2019-02-13 DIAGNOSIS — R600 Localized edema: Secondary | ICD-10-CM

## 2019-02-13 DIAGNOSIS — R0789 Other chest pain: Secondary | ICD-10-CM

## 2019-02-13 DIAGNOSIS — I119 Hypertensive heart disease without heart failure: Secondary | ICD-10-CM

## 2019-03-14 ENCOUNTER — Encounter (HOSPITAL_COMMUNITY): Payer: Self-pay

## 2019-03-14 LAB — BASIC METABOLIC PANEL
BUN/Creatinine Ratio: 14 (ref 9–23)
BUN: 13 mg/dL (ref 6–24)
CO2: 27 mmol/L (ref 20–29)
Calcium: 9.2 mg/dL (ref 8.7–10.2)
Chloride: 103 mmol/L (ref 96–106)
Creatinine, Ser: 0.92 mg/dL (ref 0.57–1.00)
GFR calc Af Amer: 85 mL/min/{1.73_m2} (ref >59)
GFR calc non Af Amer: 74 mL/min/{1.73_m2} (ref >59)
Glucose: 89 mg/dL (ref 65–99)
Potassium: 3.8 mmol/L (ref 3.5–5.2)
Sodium: 142 mmol/L (ref 134–144)

## 2019-03-15 ENCOUNTER — Telehealth (HOSPITAL_COMMUNITY): Payer: Self-pay | Admitting: Emergency Medicine

## 2019-03-15 NOTE — Telephone Encounter (Signed)
Reaching out to patient to offer assistance regarding upcoming cardiac imaging study; pt verbalizes understanding of appt date/time, parking situation and where to check in, pre-test NPO status and medications ordered, and verified current allergies; name and call back number provided for further questions should they arise Elodie Panameno RN Navigator Cardiac Imaging [facility] [phone] office [phone] cell 

## 2019-03-16 ENCOUNTER — Ambulatory Visit (HOSPITAL_COMMUNITY)
Admission: RE | Admit: 2019-03-16 | Discharge: 2019-03-16 | Disposition: A | Discharge status: Home / Self Care | Payer: BC Managed Care – PPO | Source: Ambulatory Visit | Attending: Cardiovascular Disease | Admitting: Cardiovascular Disease

## 2019-03-16 ENCOUNTER — Other Ambulatory Visit: Payer: Self-pay

## 2019-03-16 DIAGNOSIS — R0789 Other chest pain: Secondary | ICD-10-CM | POA: Insufficient documentation

## 2019-03-16 MED ORDER — IOHEXOL 350 MG/ML SOLN
100.0000 mL | Freq: Once | INTRAVENOUS | Status: AC | PRN
  Administered 2019-03-16: 100 mL via INTRAVENOUS

## 2019-03-16 MED ORDER — NITROGLYCERIN 0.4 MG SL SUBL: 0.8000 mg | SUBLINGUAL_TABLET | Freq: Once | SUBLINGUAL | Status: AC

## 2019-03-16 MED ORDER — NITROGLYCERIN 0.4 MG SL SUBL
SUBLINGUAL_TABLET | SUBLINGUAL | Status: AC
  Administered 2019-03-16: 0.8 mg via SUBLINGUAL
  Filled 2019-03-16: qty 2

## 2019-03-16 NOTE — Progress Notes (Signed)
Post Cardiac CT

## 2019-09-17 ENCOUNTER — Other Ambulatory Visit: Payer: Self-pay

## 2019-09-17 NOTE — Telephone Encounter (Signed)
Pt calling requesting a refill on triamterene-HCTZ. This medication has not been refilled since 2012. Would Dr. Elease Hashimoto like to refill this medication? Please address

## 2019-09-20 MED ORDER — TRIAMTERENE-HCTZ 37.5-25 MG PO TABS: 0.5000 | ORAL_TABLET | Freq: Every day | ORAL | 1 refills | Status: DC

## 2019-09-20 NOTE — Telephone Encounter (Signed)
Ok to refill Maxzide

## 2020-02-01 ENCOUNTER — Ambulatory Visit: Payer: BC Managed Care – PPO | Admitting: Cardiovascular Disease

## 2020-03-14 ENCOUNTER — Other Ambulatory Visit: Payer: Self-pay

## 2020-03-14 ENCOUNTER — Ambulatory Visit: Payer: BC Managed Care – PPO | Admitting: Cardiovascular Disease

## 2020-03-14 ENCOUNTER — Encounter: Payer: Self-pay | Admitting: Cardiovascular Disease

## 2020-03-14 VITALS — BP 112/82 | HR 61 | Ht 65.0 in | Wt 236.2 lb

## 2020-03-14 DIAGNOSIS — I10 Essential (primary) hypertension: Secondary | ICD-10-CM

## 2020-03-14 LAB — BASIC METABOLIC PANEL
BUN/Creatinine Ratio: 11 (ref 9–23)
BUN: 9 mg/dL (ref 6–24)
CO2: 24 mmol/L (ref 20–29)
Calcium: 9.6 mg/dL (ref 8.7–10.2)
Chloride: 98 mmol/L (ref 96–106)
Creatinine, Ser: 0.79 mg/dL (ref 0.57–1.00)
GFR calc Af Amer: 102 mL/min/{1.73_m2} (ref >59)
GFR calc non Af Amer: 88 mL/min/{1.73_m2} (ref >59)
Glucose: 77 mg/dL (ref 65–99)
Potassium: 3.6 mmol/L (ref 3.5–5.2)
Sodium: 140 mmol/L (ref 134–144)

## 2020-03-14 MED ORDER — METOPROLOL SUCCINATE ER 25 MG PO TB24: 25.0000 mg | ORAL_TABLET | Freq: Every day | ORAL | 3 refills | Status: DC

## 2020-03-14 MED ORDER — TRIAMTERENE-HCTZ 37.5-25 MG PO TABS: 0.5000 | ORAL_TABLET | Freq: Every day | ORAL | 3 refills | Status: DC

## 2020-03-14 NOTE — Patient Instructions (Addendum)
Medication Instructions:  Your physician recommends that you continue on your current medications as directed. Please refer to the Current Medication list given to you today.  *If you need a refill on your cardiac medications before your next appointment, please call your pharmacy*   Lab Work: TODAY:BMET If you have labs (blood work) drawn today and your tests are completely normal, you will receive your results only by: Marland Kitchen MyChart Message (if you have MyChart) OR . A paper copy in the mail If you have any lab test that is abnormal or we need to change your treatment, we will call you to review the results.   Testing/Procedures: none   Follow-Up: At [facility], you and your health needs are our priority.  As part of our continuing mission to provide you with exceptional heart care, we have created designated Provider Care Teams.  These Care Teams include your primary Cardiologist (physician) and Advanced Practice Providers (APPs -  Physician Assistants and Nurse Practitioners) who all work together to provide you with the care you need, when you need it.  We recommend signing up for the patient portal called "MyChart".  Sign up information is provided on this After Visit Summary.  MyChart is used to connect with patients for Virtual Visits (Telemedicine).  Patients are able to view lab/test results, encounter notes, upcoming appointments, etc.  Non-urgent messages can be sent to your provider as well.   To learn more about what you can do with MyChart, go to [email or web address].    Your next appointment:   1 year(s)  The format for your next appointment:   In Person  Provider:   You may see Kristeen Miss, MD or one of the following Advanced Practice Providers on your designated Care Team:    Tereso Newcomer, PA-C  [place], [place]

## 2020-03-14 NOTE — Progress Notes (Signed)
Cardiology Office Note   Date:  03/14/2020   ID:  Shirley Morris, DOB [birth year], MRN [id number]  PCP:  No primary care provider on file.  Cardiologist:   Kristeen Miss, MD   Chief Complaint  Patient presents with  . Hypertension    1. Hypertension 2. History of obesity-status post gastric bypass 3. Chest pain 4. Supraventricular tachycardia    Shirley Morris is a 50 y.o. female who presents for follow up of her HTN  she had  GYN surgery . Her pre-op myoview showed some abnormalities but the subsequent cardiac cath  was completely normal.   She had a hysterectomy and unfortunate had some healing complications  related to that. She still getting wound care.  BP and heart rate have remained normal.  March 23, 2018:  Patient is seen today for follow-up of her retention and history of chest discomfort. [place] now Has had some leg swelling  No CP , breathing is ok Has DOE climbing stairs.    Her last stress test was in November, 2015 which revealed a very mild anterior defect likely due to breast attenuation.   She was needing to have a partial hysterectomy so we proceed with heart catheterization.  The heart cath  In 2015 revealed normal coronary arteries.  She has normal left ventricular systolic function.   Has leg edema - especially at the end of the day  We discussed the Lounge Dr. Danie Binder rest   Jan. 4, 2021  Shirley Morris is seen today for follow-up of her episodes of chest discomfort. Body mass index is 46.12 kg/m. Wt is 277 lbs.  Has lost 23 lbs since Oct, 2019  She is status post gastric bypass. She is she is had chest pain for years.  She had a normal heart catheterization in 2015.  She has had some atypical CP ,  Sharp pain. Lasted 3 minutes.   Left side of chest .  Work up with pain under her left breast Lasted for 15  Minutes.   Took EX tylenol  Is not getting much exercise.  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 18, 2022 Wt today is 236 ( from 300 lbs )  Doing well .   No dyspnea.   Getting some exercise   Past Medical History:  Diagnosis Date  . Anemia   . Cesarean delivery delivered    for fetal distress  . Chest pain    Negative stress echo in February of 2011  . Constipation   . Family history of colon cancer   . Hypertension   . Injury of thumb, right   . Migraine   . Palpitations   . SVT (supraventricular tachycardia) (HCC)   . Tear of meniscus of right knee     Past Surgical History:  Procedure Laterality Date  . CESAREAN SECTION    . 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 KNEE ARTHROSCOPY  2011  . LAPAROSCOPIC ASSISTED VAGINAL HYSTERECTOMY Right 12/17/2013   Procedure: LAPAROSCOPIC ASSISTED VAGINAL HYSTERECTOMY WITH RIGHT SALPINGO OOPHERECTOMY ;  Surgeon: Hal Morales, MD;  Location: [facility];  Service: Gynecology;  Laterality: Right;  . LEFT HEART CATHETERIZATION WITH CORONARY ANGIOGRAM N/A 12/11/2013   Procedure: LEFT HEART CATHETERIZATION WITH CORONARY ANGIOGRAM;  Surgeon: Peter M Swaziland, MD;  Location: [facility] CATH LAB;  Service: Cardiovascular;  Laterality: N/A;  . US ECHOCARDIOGRAPHY  11/26/2008   EF 55-60%  Current Outpatient Medications  Medication Sig Dispense Refill  . Ascorbic Acid (VITAMIN C) 500 MG CAPS Take 1 capsule by mouth daily.    . calcium citrate (CALCITRATE - DOSED IN MG ELEMENTAL CALCIUM) 950 MG tablet Take 1 tablet by mouth daily.    . celecoxib (CELEBREX) 200 MG capsule Take 200 mg by mouth daily.    . cetirizine (ZYRTEC) 10 MG tablet Take 10 mg by mouth daily.     . cyclobenzaprine (FLEXERIL) 5 MG tablet Take 1 tablet by mouth as needed.    . fluocinonide cream (LIDEX) 0.05 % Apply 1 application topically daily as needed. Eczema  0  . imipramine (TOFRANIL) 25 MG tablet Take 75 mg by mouth at bedtime.    . metoprolol succinate (TOPROL XL) 25 MG 24 hr tablet Take 1 tablet (25 mg total) by mouth daily. 90 tablet 3  .  Multiple Vitamin (MULTIVITAMIN) tablet Take 1 tablet by mouth daily.    Marland Kitchen OZEMPIC, 1 MG/DOSE, 4 MG/3ML SOPN Inject 1 mg into the skin once a week.    Marland Kitchen tiZANidine (ZANAFLEX) 4 MG tablet TAKE 1 TABLET (4 MG TOTAL) BY MOUTH EVERY SIX (6) HOURS AS NEEDED.    Marland Kitchen triamterene-hydrochlorothiazide (MAXZIDE-25) 37.5-25 MG tablet Take 0.5 tablets by mouth daily. 45 tablet 1  . UNABLE TO FIND Take 500 mg by mouth daily. Tumeric    . Vitamin D, Ergocalciferol, (DRISDOL) 1.25 MG (50000 UT) CAPS capsule Take 50,000 Units by mouth every 7 (seven) days.      No current facility-administered medications for this visit.    Allergies:   Penicillins    Social History:  The patient  reports that she has never smoked. She has never used smokeless tobacco. She reports that she does not drink alcohol and does not use drugs.   Family History:  The patient's family history includes Colon cancer in her cousin and maternal uncle; Coronary artery disease in her father; Diabetes in her father; Hypertension in her father.    ROS:  Please see the history of present illness.   Physical Exam: Blood pressure 112/82, pulse 61, height 5\' 5"  (1.651 m), weight 236 lb 3.2 oz (107.1 kg), SpO2 99 %.  GEN:  Moderately obese , middle age female, NAD  HEENT: Normal NECK: No JVD; No carotid bruits LYMPHATICS: No lymphadenopathy CARDIAC: RRR , no murmurs, rubs, gallops RESPIRATORY:  Clear to auscultation without rales, wheezing or rhonchi  ABDOMEN: Soft, non-tender, non-distended MUSCULOSKELETAL:  No edema; No deformity  SKIN: Warm and dry NEUROLOGIC:  Alert and oriented x 3    EKG:     Feb. 18, 2022:  NSR at 61  No ST or T wave changes.   Recent Labs: No results found for requested labs within last 8760 hours.    Lipid Panel No results found for: CHOL, TRIG, HDL, CHOLHDL, VLDL, LDLCALC, LDLDIRECT    Wt Readings from Last 3 Encounters:  03/14/20 236 lb 3.2 oz (107.1 kg)  01/29/19 277 lb 1.9 oz (125.7 kg)  03/23/18  286 lb 6.4 oz (129.9 kg)      Other studies Reviewed: Additional studies/ records that were reviewed today include:  Cardiac cath notes, angiograms Review of the above records demonstrates:  Normal coronaries    ASSESSMENT AND PLAN:  1. Hypertension -    BP is well controlled.   Cont current meds.  Encouraged continued weight loss   2. History of obesity  -  Has lost lots of weight over the  past several years.   Cont diet .  Encouraged more exercise      Current medicines are reviewed at length with the patient today.  The patient does not have concerns regarding medicines.  The following changes have been made:  no change   Disposition:       Signed, Kristeen Miss, MD  03/14/2020 10:50 AM    [facility] [address] [place], [place], [place]  [address] Phone: [phone]; Fax: [phone]

## 2020-06-30 ENCOUNTER — Encounter (HOSPITAL_BASED_OUTPATIENT_CLINIC_OR_DEPARTMENT_OTHER): Payer: Self-pay | Admitting: General Surgery

## 2020-06-30 ENCOUNTER — Other Ambulatory Visit: Payer: Self-pay

## 2020-07-02 ENCOUNTER — Other Ambulatory Visit: Payer: Self-pay | Admitting: General Surgery

## 2020-07-08 ENCOUNTER — Other Ambulatory Visit: Payer: Self-pay

## 2020-07-08 ENCOUNTER — Encounter (HOSPITAL_BASED_OUTPATIENT_CLINIC_OR_DEPARTMENT_OTHER): Payer: Self-pay | Admitting: General Surgery

## 2020-07-08 ENCOUNTER — Ambulatory Visit (HOSPITAL_BASED_OUTPATIENT_CLINIC_OR_DEPARTMENT_OTHER): Payer: BC Managed Care – PPO | Admitting: Certified Registered"

## 2020-07-08 ENCOUNTER — Encounter (HOSPITAL_BASED_OUTPATIENT_CLINIC_OR_DEPARTMENT_OTHER)
Admission: RE | Disposition: A | Discharge status: Home / Self Care | Payer: Self-pay | Source: Home / Self Care | Attending: General Surgery

## 2020-07-08 ENCOUNTER — Ambulatory Visit (HOSPITAL_BASED_OUTPATIENT_CLINIC_OR_DEPARTMENT_OTHER)
Admission: RE | Admit: 2020-07-08 | Discharge: 2020-07-08 | Disposition: A | Discharge status: Home / Self Care | Payer: BC Managed Care – PPO | Attending: General Surgery | Admitting: General Surgery

## 2020-07-08 ENCOUNTER — Encounter (HOSPITAL_BASED_OUTPATIENT_CLINIC_OR_DEPARTMENT_OTHER)
Admission: RE | Admit: 2020-07-08 | Discharge: 2020-07-08 | Disposition: A | Discharge status: Home / Self Care | Payer: BC Managed Care – PPO | Source: Ambulatory Visit | Attending: General Surgery | Admitting: General Surgery

## 2020-07-08 DIAGNOSIS — Z791 Long term (current) use of non-steroidal anti-inflammatories (NSAID): Secondary | ICD-10-CM | POA: Diagnosis not present

## 2020-07-08 DIAGNOSIS — Z79899 Other long term (current) drug therapy: Secondary | ICD-10-CM | POA: Insufficient documentation

## 2020-07-08 DIAGNOSIS — N6310 Unspecified lump in the right breast, unspecified quadrant: Secondary | ICD-10-CM | POA: Diagnosis present

## 2020-07-08 DIAGNOSIS — Z9884 Bariatric surgery status: Secondary | ICD-10-CM | POA: Insufficient documentation

## 2020-07-08 DIAGNOSIS — N6081 Other benign mammary dysplasias of right breast: Secondary | ICD-10-CM | POA: Diagnosis not present

## 2020-07-08 HISTORY — PX: BREAST BIOPSY: SHX20

## 2020-07-08 LAB — BASIC METABOLIC PANEL
Anion gap: 8 (ref 5–15)
BUN: 9 mg/dL (ref 6–20)
CO2: 30 mmol/L (ref 22–32)
Calcium: 9.5 mg/dL (ref 8.9–10.3)
Chloride: 99 mmol/L (ref 98–111)
Creatinine, Ser: 1 mg/dL (ref 0.44–1.00)
GFR, Estimated: >60 mL/min (ref >60)
Glucose, Bld: 79 mg/dL (ref 70–99)
Potassium: 3.8 mmol/L (ref 3.5–5.1)
Sodium: 137 mmol/L (ref 135–145)

## 2020-07-08 SURGERY — BREAST BIOPSY
Anesthesia: Monitor Anesthesia Care | Site: Breast | Laterality: Right

## 2020-07-08 MED ORDER — LACTATED RINGERS IV SOLN: INTRAVENOUS | Status: DC

## 2020-07-08 MED ORDER — BUPIVACAINE HCL (PF) 0.25 % IJ SOLN
INTRAMUSCULAR | Status: DC | PRN
  Administered 2020-07-08: 10 mL

## 2020-07-08 MED ORDER — CIPROFLOXACIN IN D5W 400 MG/200ML IV SOLN
400.0000 mg | INTRAVENOUS | Status: AC
  Administered 2020-07-08: 400 mg via INTRAVENOUS

## 2020-07-08 MED ORDER — PROMETHAZINE HCL 25 MG/ML IJ SOLN: 6.2500 mg | INTRAMUSCULAR | Status: DC | PRN

## 2020-07-08 MED ORDER — HYDROMORPHONE HCL 1 MG/ML IJ SOLN: 0.2500 mg | INTRAMUSCULAR | Status: DC | PRN

## 2020-07-08 MED ORDER — ONDANSETRON HCL 4 MG/2ML IJ SOLN
INTRAMUSCULAR | Status: AC
  Filled 2020-07-08: qty 2

## 2020-07-08 MED ORDER — LIDOCAINE HCL (PF) 2 % IJ SOLN
INTRAMUSCULAR | Status: AC
  Filled 2020-07-08: qty 5

## 2020-07-08 MED ORDER — ENSURE PRE-SURGERY PO LIQD
296.0000 mL | Freq: Once | ORAL | Status: DC
Start: 2020-07-09 — End: 2020-07-08

## 2020-07-08 MED ORDER — PROPOFOL 500 MG/50ML IV EMUL
INTRAVENOUS | Status: AC
  Filled 2020-07-08: qty 50

## 2020-07-08 MED ORDER — OXYCODONE HCL 5 MG/5ML PO SOLN: 5.0000 mg | Freq: Once | ORAL | Status: DC | PRN

## 2020-07-08 MED ORDER — MIDAZOLAM HCL 2 MG/2ML IJ SOLN
INTRAMUSCULAR | Status: AC
  Filled 2020-07-08: qty 2

## 2020-07-08 MED ORDER — ACETAMINOPHEN 500 MG PO TABS
1000.0000 mg | ORAL_TABLET | ORAL | Status: DC
Start: 2020-07-09 — End: 2020-07-08

## 2020-07-08 MED ORDER — OXYCODONE HCL 5 MG PO TABS: 5.0000 mg | ORAL_TABLET | Freq: Once | ORAL | Status: DC | PRN

## 2020-07-08 MED ORDER — CELECOXIB 200 MG PO CAPS
400.0000 mg | ORAL_CAPSULE | ORAL | Status: AC
  Administered 2020-07-08: 400 mg via ORAL

## 2020-07-08 MED ORDER — CIPROFLOXACIN IN D5W 400 MG/200ML IV SOLN
INTRAVENOUS | Status: AC
  Filled 2020-07-08: qty 200

## 2020-07-08 MED ORDER — CELECOXIB 200 MG PO CAPS
ORAL_CAPSULE | ORAL | Status: AC
  Filled 2020-07-08: qty 2

## 2020-07-08 MED ORDER — PHENYLEPHRINE HCL (PRESSORS) 10 MG/ML IV SOLN
INTRAVENOUS | Status: DC | PRN
  Administered 2020-07-08 (×2): 80 ug via INTRAVENOUS

## 2020-07-08 MED ORDER — ACETAMINOPHEN 500 MG PO TABS
ORAL_TABLET | ORAL | Status: AC
  Filled 2020-07-08: qty 2

## 2020-07-08 MED ORDER — FENTANYL CITRATE (PF) 100 MCG/2ML IJ SOLN
INTRAMUSCULAR | Status: AC
  Filled 2020-07-08: qty 2

## 2020-07-08 MED ORDER — PROPOFOL 10 MG/ML IV BOLUS
INTRAVENOUS | Status: DC | PRN
  Administered 2020-07-08: 30 mg via INTRAVENOUS

## 2020-07-08 MED ORDER — FENTANYL CITRATE (PF) 100 MCG/2ML IJ SOLN
INTRAMUSCULAR | Status: DC | PRN
  Administered 2020-07-08 (×2): 50 ug via INTRAVENOUS

## 2020-07-08 MED ORDER — MIDAZOLAM HCL 2 MG/2ML IJ SOLN
INTRAMUSCULAR | Status: DC | PRN
  Administered 2020-07-08: 2 mg via INTRAVENOUS

## 2020-07-08 MED ORDER — PROPOFOL 500 MG/50ML IV EMUL
INTRAVENOUS | Status: DC | PRN
  Administered 2020-07-08: 100 ug/kg/min via INTRAVENOUS

## 2020-07-08 MED ORDER — PROPOFOL 10 MG/ML IV BOLUS
INTRAVENOUS | Status: AC
  Filled 2020-07-08: qty 20

## 2020-07-08 MED ORDER — ONDANSETRON HCL 4 MG/2ML IJ SOLN
INTRAMUSCULAR | Status: DC | PRN
  Administered 2020-07-08: 4 mg via INTRAVENOUS

## 2020-07-08 MED ORDER — LIDOCAINE HCL (CARDIAC) PF 100 MG/5ML IV SOSY
PREFILLED_SYRINGE | INTRAVENOUS | Status: DC | PRN
  Administered 2020-07-08: 40 mg via INTRAVENOUS

## 2020-07-08 SURGICAL SUPPLY — 55 items
ADH SKN CLS APL DERMABOND .7 (GAUZE/BANDAGES/DRESSINGS) ×1
APL PRP STRL LF DISP 70% ISPRP (MISCELLANEOUS) ×1
APPLIER CLIP 9.375 MED OPEN (MISCELLANEOUS)
APR CLP MED 9.3 20 MLT OPN (MISCELLANEOUS)
BINDER BREAST LRG (GAUZE/BANDAGES/DRESSINGS) IMPLANT
BINDER BREAST MEDIUM (GAUZE/BANDAGES/DRESSINGS) IMPLANT
BINDER BREAST XLRG (GAUZE/BANDAGES/DRESSINGS) IMPLANT
BINDER BREAST XXLRG (GAUZE/BANDAGES/DRESSINGS) IMPLANT
BLADE SURG 15 STRL LF DISP TIS (BLADE) ×1 IMPLANT
BLADE SURG 15 STRL SS (BLADE) ×3
CANISTER SUCT 1200ML W/VALVE (MISCELLANEOUS) IMPLANT
CHLORAPREP W/TINT 26 (MISCELLANEOUS) ×3 IMPLANT
CLIP APPLIE 9.375 MED OPEN (MISCELLANEOUS) IMPLANT
CLIP VESOCCLUDE SM WIDE 6/CT (CLIP) IMPLANT
CLOSURE WOUND 1/2 X4 (GAUZE/BANDAGES/DRESSINGS) ×1
COVER BACK TABLE 60X90IN (DRAPES) ×3 IMPLANT
COVER MAYO STAND STRL (DRAPES) ×3 IMPLANT
COVER WAND RF STERILE (DRAPES) IMPLANT
DECANTER SPIKE VIAL GLASS SM (MISCELLANEOUS) IMPLANT
DERMABOND ADVANCED (GAUZE/BANDAGES/DRESSINGS) ×2
DERMABOND ADVANCED .7 DNX12 (GAUZE/BANDAGES/DRESSINGS) ×1 IMPLANT
DRAPE LAPAROSCOPIC ABDOMINAL (DRAPES) ×3 IMPLANT
DRAPE UTILITY XL STRL (DRAPES) ×3 IMPLANT
DRSG TEGADERM 4X4.75 (GAUZE/BANDAGES/DRESSINGS) IMPLANT
ELECT COATED BLADE 2.86 ST (ELECTRODE) ×3 IMPLANT
ELECT REM PT RETURN 9FT ADLT (ELECTROSURGICAL) ×3
ELECTRODE REM PT RTRN 9FT ADLT (ELECTROSURGICAL) ×1 IMPLANT
GAUZE SPONGE 4X4 12PLY STRL LF (GAUZE/BANDAGES/DRESSINGS) ×3 IMPLANT
GLOVE SURG ENC MOIS LTX SZ7 (GLOVE) ×3 IMPLANT
GLOVE SURG UNDER POLY LF SZ7.5 (GLOVE) ×3 IMPLANT
GOWN STRL REUS W/ TWL LRG LVL3 (GOWN DISPOSABLE) ×2 IMPLANT
GOWN STRL REUS W/TWL LRG LVL3 (GOWN DISPOSABLE) ×6
HEMOSTAT ARISTA ABSORB 3G PWDR (HEMOSTASIS) IMPLANT
KIT MARKER MARGIN INK (KITS) IMPLANT
NEEDLE HYPO 25X1 1.5 SAFETY (NEEDLE) ×3 IMPLANT
NS IRRIG 1000ML POUR BTL (IV SOLUTION) IMPLANT
PACK BASIN DAY SURGERY FS (CUSTOM PROCEDURE TRAY) ×3 IMPLANT
PENCIL SMOKE EVACUATOR (MISCELLANEOUS) ×3 IMPLANT
RETRACTOR ONETRAX LX 90X20 (MISCELLANEOUS) IMPLANT
SLEEVE SCD COMPRESS KNEE MED (STOCKING) ×3 IMPLANT
SPONGE LAP 4X18 RFD (DISPOSABLE) ×3 IMPLANT
STRIP CLOSURE SKIN 1/2X4 (GAUZE/BANDAGES/DRESSINGS) ×2 IMPLANT
SUT MNCRL AB 4-0 PS2 18 (SUTURE) ×3 IMPLANT
SUT MON AB 5-0 PS2 18 (SUTURE) IMPLANT
SUT SILK 2 0 SH (SUTURE) ×3 IMPLANT
SUT VIC AB 2-0 SH 27 (SUTURE) ×3
SUT VIC AB 2-0 SH 27XBRD (SUTURE) ×1 IMPLANT
SUT VIC AB 3-0 SH 27 (SUTURE) ×3
SUT VIC AB 3-0 SH 27X BRD (SUTURE) ×1 IMPLANT
SYR CONTROL 10ML LL (SYRINGE) ×3 IMPLANT
TOWEL GREEN STERILE FF (TOWEL DISPOSABLE) ×3 IMPLANT
TRAY FAXITRON CT DISP (TRAY / TRAY PROCEDURE) IMPLANT
TUBE CONNECTING 20'X1/4 (TUBING)
TUBE CONNECTING 20X1/4 (TUBING) IMPLANT
YANKAUER SUCT BULB TIP NO VENT (SUCTIONS) IMPLANT

## 2020-07-08 NOTE — Transfer of Care (Signed)
Immediate Anesthesia Transfer of Care Note  Patient: Shirley Morris  Procedure(s) Performed: EXCISION OF RIGHT BREAST MASS (Right: Breast)  Patient Location: PACU  Anesthesia Type:MAC  Level of Consciousness: drowsy  Airway & Oxygen Therapy: Patient Spontanous Breathing and Patient connected to face mask oxygen  Post-op Assessment: Report given to RN and Post -op Vital signs reviewed and stable  Post vital signs: Reviewed and stable  Last Vitals:  Vitals Value Taken Time  BP 127/68 07/08/20 1546  Temp    Pulse 84 07/08/20 1548  Resp 12 07/08/20 1548  SpO2 100 % 07/08/20 1548  Vitals shown include unvalidated device data.  Last Pain:  Vitals:   07/08/20 1401  TempSrc: Oral  PainSc: 0-No pain         Complications: No notable events documented.

## 2020-07-08 NOTE — H&P (Signed)
50 yof who noted a right breast mass in medial im fold. this is tender. she has no prior history. no nipple dc. she has noted this recently.  no drainage.  mm shows moderately dense breasts (b).  there is lobulated asymmetric density measuring 12 mm just anterior to chest wall noted.  US shows a subcutaneous lobulated 11x12x5 mm wider than tall nodule.  she is here to discuss options                                                                          Past Surgical History  Breast Biopsy   Left. Cesarean Section - 1   Gastric Bypass   Hysterectomy (not due to cancer) - Partial   Knee Surgery   Right.  Diagnostic Studies History  Colonoscopy   never Mammogram   within last year Pap Smear   1-5 years ago  Allergies  No Known Drug Allergies    Allergies Reconciled    Medication History  Fluconazole  (150MG Tablet, Oral) Active. Imipramine HCl  (25MG Tablet, Oral) Active. Metoprolol Succinate ER  (25MG Tablet ER 24HR, Oral) Active. Meloxicam  (15MG Tablet, Oral) Active. Triamterene-HCTZ  (37.5-25MG Tablet, Oral) Active. Medications Reconciled   Social History  Caffeine use   Carbonated beverages, Coffee, Tea. No alcohol use   No drug use   Tobacco use   Never smoker.  Family History Arthritis   Father, Mother. Breast Cancer   Family Members In [place] Members In General. Colon Polyps   Mother. Diabetes Mellitus   Father. Heart disease in female family member before age 106   Hypertension   Family Members In General, Father. Kidney Disease   Family Members In General. Prostate Cancer   Family Members In General. Thyroid problems   Family Members In General.  Pregnancy / Birth History  Age at menarche   50 years, 70 years. Gravida   1 Irregular periods   Maternal age   50-25 Para   1  Other Problems  Arthritis   High blood pressure   Lump In Breast   Oophorectomy     Review of Systems General Not Present- Appetite Loss, Chills,  Fatigue, Fever, Night Sweats, Weight Gain and Weight Loss. Skin Present- Change in Wart/Mole. Not Present- Dryness, Hives, Jaundice, New Lesions, Non-Healing Wounds, Rash and Ulcer. HEENT Not Present- Earache, Hearing Loss, Hoarseness, Nose Bleed, Oral Ulcers, Ringing in the Ears, Seasonal Allergies, Sinus Pain, Sore Throat, Visual Disturbances, Wears glasses/contact lenses and Yellow Eyes. Respiratory Not Present- Bloody sputum, Chronic Cough, Difficulty Breathing, Snoring and Wheezing. Breast Present- Breast Mass. Not Present- Breast Pain, Nipple Discharge and Skin Changes. Cardiovascular Not Present- Chest Pain, Difficulty Breathing Lying Down, Leg Cramps, Palpitations, Rapid Heart Rate, Shortness of Breath and Swelling of Extremities. Gastrointestinal Not Present- Abdominal Pain, Bloating, Bloody Stool, Change in Bowel Habits, Chronic diarrhea, Constipation, Difficulty Swallowing, Excessive gas, Gets full quickly at meals, Hemorrhoids, Indigestion, Nausea, Rectal Pain and Vomiting. Female Genitourinary Not Present- Frequency, Nocturia, Painful Urination, Pelvic Pain and Urgency. Musculoskeletal Not Present- Back Pain, Joint Pain, Joint Stiffness, Muscle Pain, Muscle Weakness and Swelling of Extremities. Neurological Not Present- Decreased Memory, Fainting, Headaches, Numbness, Seizures, Tingling, Tremor, Trouble  walking and Weakness. Psychiatric Not Present- Anxiety, Bipolar, Change in Sleep Pattern, Depression, Fearful and Frequent crying. Endocrine Not Present- Cold Intolerance, Excessive Hunger, Hair Changes, Heat Intolerance, Hot flashes and New Diabetes. Hematology Not Present- Blood Thinners, Easy Bruising, Excessive bleeding, Gland problems, HIV and Persistent Infections.     Physical Exam  General Mental Status - Alert. Orientation - Oriented X3. Breast Nipples - No Discharge. Note:  right medial im fold 2x1 cm mass, I think this is skin related and not an actual breast mass,  mildly tender with overyling skin changes Cv rr  Pulm effort nl   Assessment & Plan  BREAST MASS, RIGHT (N63.10) Story: Excision of right breast (skin) mass I dont think biopsy indicated as I think this is skin related (? eic). discussed excision which she desires under local/mac. will proceed

## 2020-07-08 NOTE — Op Note (Signed)
Preoperative diagnosis: Right breast mass Postoperative diagnosis: Same as above Procedure: Right breast mass excision Surgeon: Dr. Harden Mo Anesthesia: Local with sedation Estimated blood loss: Minimal Specimens: Ellipse of skin and right breast tissue marked short superior, long lateral Complications: None Drains: None Special count was correct completion Disposition to recovery stable condition  Indications: 69 yof who noted a right breast mass in medial im fold. this is tender. she has no prior history. no nipple dc. she has noted this recently.  no drainage.  mm shows moderately dense breasts (b).  there is lobulated asymmetric density measuring 12 mm just anterior to chest wall noted.  US shows a subcutaneous lobulated 11x12x5 mm wider than tall nodule.  I thought this was more related to her skin in her breast and we discussed excision of this area as opposed to biopsy.  Procedure: After informed consent was obtained the patient was taken to the operating room.  She and I had both identified this area prior to beginning.  She was given antibiotics.  SCDs were in place.  She was placed under anesthesia care.  She was sedated.  She was prepped and draped in the standard sterile surgical fashion.  A surgical timeout was then performed.  I infiltrated local anesthetic all around the area.  I then made elliptical incision to encompass the skin overlying this mass as it was very close.  I then used cautery to excise the mass and some of the surrounding tissue.  This was marked and passed off the table.  I then closed this with 3-0 Vicryl and 4-0 Monocryl.  Glue and Steri-Strips were applied.  She tolerated this well and was transferred to recovery.

## 2020-07-08 NOTE — Anesthesia Preprocedure Evaluation (Signed)
Anesthesia Evaluation  Patient identified by MRN, date of birth, ID band Patient awake    Reviewed: Allergy & Precautions, H&P , NPO status , Patient's Chart, lab work & pertinent test results, reviewed documented beta blocker date and time   Airway Mallampati: III  TM Distance: >3 FB Neck ROM: Full    Dental no notable dental hx. (+) Teeth Intact   Pulmonary neg pulmonary ROS,    Pulmonary exam normal breath sounds clear to auscultation       Cardiovascular hypertension, Pt. on medications and Pt. on home beta blockers Normal cardiovascular exam+ dysrhythmias Supra Ventricular Tachycardia  Rhythm:Regular Rate:Normal     Neuro/Psych  Headaches, Depression negative psych ROS   GI/Hepatic negative GI ROS, Neg liver ROS,   Endo/Other  Morbid obesity  Renal/GU negative Renal ROS  negative genitourinary   Musculoskeletal negative musculoskeletal ROS (+)   Abdominal (+) + obese,   Peds  Hematology  (+) anemia ,   Anesthesia Other Findings   Reproductive/Obstetrics Pelvic Pain                             Anesthesia Physical  Anesthesia Plan  ASA: 2  Anesthesia Plan: MAC   Post-op Pain Management:    Induction: Intravenous  PONV Risk Score and Plan: 2 and Ondansetron, Midazolam and Treatment may vary due to age or medical condition  Airway Management Planned: Simple Face Mask  Additional Equipment:   Intra-op Plan:   Post-operative Plan:   Informed Consent: I have reviewed the patients History and Physical, chart, labs and discussed the procedure including the risks, benefits and alternatives for the proposed anesthesia with the patient or authorized representative who has indicated his/her understanding and acceptance.     Dental advisory given  Plan Discussed with: Anesthesiologist, CRNA and Surgeon  Anesthesia Plan Comments:         Anesthesia Quick Evaluation

## 2020-07-08 NOTE — Interval H&P Note (Signed)
History and Physical Interval Note:  07/08/2020 2:45 PM  Shirley Morris  has presented today for surgery, with the diagnosis of RIGHT BREAST MASS.  The various methods of treatment have been discussed with the patient and family. After consideration of risks, benefits and other options for treatment, the patient has consented to  Procedure(s): EXCISION OF RIGHT BREAST MASS (Right) as a surgical intervention.  The patient's history has been reviewed, patient examined, no change in status, stable for surgery.  I have reviewed the patient's chart and labs.  Questions were answered to the patient's satisfaction.     Emelia Loron

## 2020-07-08 NOTE — Discharge Instructions (Addendum)
[facility] Office Phone Number [phone] POST OP INSTRUCTIONS Take 400 mg of ibuprofen every 8 hours or 650 mg tylenol every 6 hours for next 72 hours then as needed. Use ice several times daily also. Always review your discharge instruction sheet given to you by the facility where your surgery was performed.  IF YOU HAVE DISABILITY OR FAMILY LEAVE FORMS, YOU MUST BRING THEM TO THE OFFICE FOR PROCESSING.  DO NOT GIVE THEM TO YOUR DOCTOR.  A prescription for pain medication may be given to you upon discharge.  Take your pain medication as prescribed, if needed.  If narcotic pain medicine is not needed, then you may take acetaminophen (Tylenol), naprosyn (Alleve) or ibuprofen (Advil) as needed. Take your usually prescribed medications unless otherwise directed If you need a refill on your pain medication, please contact your pharmacy.  They will contact our office to request authorization.  Prescriptions will not be filled after 5pm or on week-ends. You should eat very light the first 24 hours after surgery, such as soup, crackers, pudding, etc.  Resume your normal diet the day after surgery. Most patients will experience some swelling and bruising in the breast.  Ice packs and a good support bra will help.  Wear the breast binder provided or a sports bra for 72 hours day and night.  After that wear a sports bra during the day until you return to the office. Swelling and bruising can take several days to resolve.  It is common to experience some constipation if taking pain medication after surgery.  Increasing fluid intake and taking a stool softener will usually help or prevent this problem from occurring.  A mild laxative (Milk of Magnesia or Miralax) should be taken according to package directions if there are no bowel movements after 48 hours. Unless discharge instructions indicate otherwise, you may remove your bandages 48 hours after surgery and you may shower at that time.  You  may have steri-strips (small skin tapes) in place directly over the incision.  These strips should be left on the skin for 7-10 days and will come off on their own.  If your surgeon used skin glue on the incision, you may shower in 24 hours.  The glue will flake off over the next 2-3 weeks.  Any sutures or staples will be removed at the office during your follow-up visit. ACTIVITIES:  You may resume regular daily activities (gradually increasing) beginning the next day.  Wearing a good support bra or sports bra minimizes pain and swelling.  You may have sexual intercourse when it is comfortable. You may drive when you no longer are taking prescription pain medication, you can comfortably wear a seatbelt, and you can safely maneuver your car and apply brakes. RETURN TO WORK:  ______________________________________________________________________________________ Dennis Bast should see your doctor in the office for a follow-up appointment approximately two weeks after your surgery.  Your doctor's nurse will typically make your follow-up appointment when she calls you with your pathology report.  Expect your pathology report 3-4 business days after your surgery.  You may call to check if you do not hear from [place] after three days. OTHER INSTRUCTIONS: _______________________________________________________________________________________________ _____________________________________________________________________________________________________________________________________ _____________________________________________________________________________________________________________________________________ _____________________________________________________________________________________________________________________________________  WHEN TO CALL DR WAKEFIELD: Fever over 101.0 Nausea and/or vomiting. Extreme swelling or bruising. Continued bleeding from incision. Increased pain, redness, or drainage from the  incision.  The clinic staff is available to answer your questions during regular business hours.  Please don't hesitate to call and ask to speak to  one of the nurses for clinical concerns.  If you have a medical emergency, go to the nearest emergency room or call 911.  A surgeon from [facility] is always on call at the hospital.  For further questions, please visit [email or web address]  No ibuprofen/motrin until after 10:15pm today.  Post Anesthesia Home Care Instructions  Activity: Get plenty of rest for the remainder of the day. A responsible individual must stay with you for 24 hours following the procedure.  For the next 24 hours, DO NOT: -Drive a car -Advertising copywriter -Drink alcoholic beverages -Take any medication unless instructed by your physician -Make any legal decisions or sign important papers.  Meals: Start with liquid foods such as gelatin or soup. Progress to regular foods as tolerated. Avoid greasy, spicy, heavy foods. If nausea and/or vomiting occur, drink only clear liquids until the nausea and/or vomiting subsides. Call your physician if vomiting continues.  Special Instructions/Symptoms: Your throat may feel dry or sore from the anesthesia or the breathing tube placed in your throat during surgery. If this causes discomfort, gargle with warm salt water. The discomfort should disappear within 24 hours.  If you had a scopolamine patch placed behind your ear for the management of post- operative nausea and/or vomiting:  1. The medication in the patch is effective for 72 hours, after which it should be removed.  Wrap patch in a tissue and discard in the trash. Wash hands thoroughly with soap and water. 2. You may remove the patch earlier than 72 hours if you experience unpleasant side effects which may include dry mouth, dizziness or visual disturbances. 3. Avoid touching the patch. Wash your hands with soap and water after contact with the  patch.

## 2020-07-09 ENCOUNTER — Encounter (HOSPITAL_BASED_OUTPATIENT_CLINIC_OR_DEPARTMENT_OTHER): Payer: Self-pay | Admitting: General Surgery

## 2020-07-09 NOTE — Anesthesia Postprocedure Evaluation (Signed)
Anesthesia Post Note  Patient: Shirley Morris  Procedure(s) Performed: EXCISION OF RIGHT BREAST MASS (Right: Breast)     Patient location during evaluation: PACU Anesthesia Type: MAC Level of consciousness: awake and alert Pain management: pain level controlled Vital Signs Assessment: post-procedure vital signs reviewed and stable Respiratory status: spontaneous breathing, nonlabored ventilation and respiratory function stable Cardiovascular status: blood pressure returned to baseline and stable Postop Assessment: no apparent nausea or vomiting Anesthetic complications: no   No notable events documented.  Last Vitals:  Vitals:   07/08/20 1600 07/08/20 1637  BP: 119/85 125/80  Pulse: 86   Resp: 10 12  Temp:  36.5 C  SpO2: 96% 98%    Last Pain:  Vitals:   07/08/20 1637  TempSrc:   PainSc: 0-No pain                 Lynda Rainwater

## 2020-07-14 LAB — SURGICAL PATHOLOGY

## 2020-07-16 ENCOUNTER — Other Ambulatory Visit: Payer: Self-pay

## 2020-07-16 ENCOUNTER — Ambulatory Visit (HOSPITAL_COMMUNITY)
Admission: EM | Admit: 2020-07-16 | Discharge: 2020-07-16 | Disposition: A | Discharge status: Home / Self Care | Payer: BC Managed Care – PPO | Attending: Medical Oncology | Admitting: Medical Oncology

## 2020-07-16 ENCOUNTER — Encounter (HOSPITAL_COMMUNITY): Payer: Self-pay | Admitting: Emergency Medicine

## 2020-07-16 DIAGNOSIS — R21 Rash and other nonspecific skin eruption: Secondary | ICD-10-CM

## 2020-07-16 DIAGNOSIS — W57XXXA Bitten or stung by nonvenomous insect and other nonvenomous arthropods, initial encounter: Secondary | ICD-10-CM | POA: Diagnosis not present

## 2020-07-16 MED ORDER — PREDNISONE 10 MG (21) PO TBPK: ORAL_TABLET | Freq: Every day | ORAL | 0 refills | Status: DC

## 2020-07-16 MED ORDER — BETAMETHASONE DIPROPIONATE 0.05 % EX OINT: TOPICAL_OINTMENT | Freq: Two times a day (BID) | CUTANEOUS | 0 refills | Status: DC

## 2020-07-16 NOTE — ED Provider Notes (Signed)
[facility]    CSN: [id number] Arrival date & time: 07/16/20  1903      History   Chief Complaint Chief Complaint  Patient presents with   Insect Bite    HPI Shirley Morris is a 50 y.o. female.   HPI  Insect bites: Patient reports that she is staying at a new hotel.  She states that she was perfectly fine when she went to bed but then when she woke up she had numerous bites on her body.  She states that the bites mainly occurred on her legs and on her arms.  The bites are extremely itchy.  She has tried Benadryl, over-the-counter hydrocortisone without any relief.  She states that she is spoken with a hotel and is working with them on their bedbug plan.  No fevers, discharge, bleeding.  Past Medical History:  Diagnosis Date   Anemia    Chest pain    Negative stress echo in February of 2011   Hypertension    Injury of thumb, right    Migraine    Palpitations    SVT (supraventricular tachycardia) (HCC)    Tear of meniscus of right knee     Patient Active Problem List   Diagnosis Date Noted   Ovarian cyst 12/19/2013   Adult BMI 40.0-44.9 kg/sq m (HCC) 12/19/2013   Postoperative anemia due to acute blood loss 12/19/2013   Right ovarian cyst 12/16/2013   Abnormal nuclear stress test 12/11/2013   Breast asymmetry in female 09/23/2011   Pelvic pain in female 08/31/2011   Chest discomfort 11/27/2010   SVT (supraventricular tachycardia) (HCC) 11/27/2010   HTN (hypertension) 11/27/2010    Past Surgical History:  Procedure Laterality Date   BREAST BIOPSY Right 07/08/2020   Procedure: EXCISION OF RIGHT BREAST MASS;  Surgeon: Emelia Loron, MD;  Location: [facility];  Service: General;  Laterality: Right;   CESAREAN SECTION     Gastic bypass surgery by laparocopy  2007   KNEE ARTHROSCOPY  2011   LAPAROSCOPIC ASSISTED VAGINAL HYSTERECTOMY Right 12/17/2013   Procedure: LAPAROSCOPIC ASSISTED VAGINAL HYSTERECTOMY WITH RIGHT SALPINGO  OOPHERECTOMY ;  Surgeon: Hal Morales, MD;  Location: [facility];  Service: Gynecology;  Laterality: Right;   LEFT HEART CATHETERIZATION WITH CORONARY ANGIOGRAM N/A 12/11/2013   Procedure: LEFT HEART CATHETERIZATION WITH CORONARY ANGIOGRAM;  Surgeon: Peter M [place], MD;  Location: [facility];  Service: Cardiovascular;  Laterality: N/A;   US ECHOCARDIOGRAPHY  11/26/2008   EF 55-60%    OB History     Gravida  1   Para  1   Term      Preterm      AB      Living  1      SAB      IAB      Ectopic      Multiple      Live Births               Home Medications    Prior to Admission medications   Medication Sig Start Date End Date Taking? Authorizing Provider  betamethasone dipropionate (DIPROLENE) 0.05 % ointment Apply topically 2 (two) times daily. 07/16/20  Yes Domnick Chervenak M, PA-C  predniSONE (STERAPRED UNI-PAK 21 TAB) 10 MG (21) TBPK tablet Take by mouth daily. Take 6 tabs by mouth daily  for 2 days, then 5 tabs for 2 days, then 4 tabs for 2 days, then 3 tabs for 2 days, 2 tabs for  2 days, then 1 tab by mouth daily for 2 days 07/16/20  Yes Ludie Hudon M, PA-C  Ascorbic Acid (VITAMIN C) 500 MG CAPS Take 1 capsule by mouth daily.    [provider]  calcium citrate (CALCITRATE - DOSED IN MG ELEMENTAL CALCIUM) 950 MG tablet Take 1 tablet by mouth daily.    [provider]  celecoxib (CELEBREX) 200 MG capsule Take 200 mg by mouth daily. 02/20/20   [provider]  cetirizine (ZYRTEC) 10 MG tablet Take 10 mg by mouth daily.  02/23/18   [provider]  cyclobenzaprine (FLEXERIL) 5 MG tablet Take 1 tablet by mouth as needed.    [provider]  fluocinonide cream (LIDEX) 0.05 % Apply 1 application topically daily as needed. Eczema 11/16/13   [provider]  imipramine (TOFRANIL) 25 MG tablet Take 75 mg by mouth at bedtime.    [provider]  metoprolol succinate (TOPROL XL) 25 MG 24 hr tablet Take 1  tablet (25 mg total) by mouth daily. 03/14/20   Nahser, Deloris Ping, MD  Multiple Vitamin (MULTIVITAMIN) tablet Take 1 tablet by mouth daily.    [provider]  tiZANidine (ZANAFLEX) 4 MG tablet TAKE 1 TABLET (4 MG TOTAL) BY MOUTH EVERY SIX (6) HOURS AS NEEDED. 02/25/18   [provider]  triamterene-hydrochlorothiazide (MAXZIDE-25) 37.5-25 MG tablet Take 0.5 tablets by mouth daily. 03/14/20   Nahser, Deloris Ping, MD  UNABLE TO FIND Take 500 mg by mouth daily. Tumeric    [provider]  Vitamin D, Ergocalciferol, (DRISDOL) 1.25 MG (50000 UT) CAPS capsule Take 50,000 Units by mouth every 7 (seven) days.  01/31/18   [provider]    Family History Family History  Problem Relation Age of Onset   Coronary artery disease Father    Diabetes Father    Hypertension Father    Colon cancer Maternal Uncle    Colon cancer Cousin        First cousin maternal side     Social History Social History   Tobacco Use   Smoking status: Never   Smokeless tobacco: Never  Vaping Use   Vaping Use: Never used  Substance Use Topics   Alcohol use: No   Drug use: No     Allergies   Penicillins   Review of Systems Review of Systems  As stated above in HPI Physical Exam Triage Vital Signs ED Triage Vitals  Enc Vitals Group     BP 07/16/20 1954 101/62     Pulse Rate 07/16/20 1954 95     Resp 07/16/20 1954 20     Temp 07/16/20 1954 98.9 F (37.2 C)     Temp Source 07/16/20 1954 Oral     SpO2 07/16/20 1954 100 %     Weight --      Height --      Head Circumference --      Peak Flow --      Pain Score 07/16/20 1951 0     Pain Loc --      Pain Edu? --      Excl. in GC? --    No data found.  Updated Vital Signs BP 101/62 (BP Location: Right Arm) Comment (BP Location): large cuff  Pulse 95   Temp 98.9 F (37.2 C) (Oral)   Resp 20   SpO2 100%   Physical Exam Vitals and nursing note reviewed.  Constitutional:      General: She is not  in acute distress.     Appearance: Normal appearance. She is not ill-appearing, toxic-appearing or diaphoretic.  HENT:     Head: Normocephalic and atraumatic.  Cardiovascular:     Rate and Rhythm: Normal rate and regular rhythm.     Heart sounds: Normal heart sounds.  Pulmonary:     Effort: Pulmonary effort is normal.     Breath sounds: Normal breath sounds.  Skin:    Comments: Multiple erythematic papules of legs and arms. Few in a pattern of three  Neurological:     Mental Status: She is alert and oriented to person, place, and time.     UC Treatments / Results  Labs (all labs ordered are listed, but only abnormal results are displayed) Labs Reviewed - No data to display  EKG   Radiology No results found.  Procedures Procedures (including critical care time)  Medications Ordered in UC Medications - No data to display  Initial Impression / Assessment and Plan / UC Course  I have reviewed the triage vital signs and the nursing notes.  Pertinent labs & imaging results that were available during my care of the patient were reviewed by me and considered in my medical decision making (see chart for details).     New.  Discussed with patient the continued plan for reducing the risk for continued bedbugs should that this be the cause of her symptoms.  For now we discussed betamethasone versus oral steroids. Pt wishes to have both but I have recommended starting with steroid cream first to avoid potential side effects that we discussed.    Final Clinical Impressions(s) / UC Diagnoses   Final diagnoses:  Insect bite, unspecified site, initial encounter   Discharge Instructions   None    ED Prescriptions     Medication Sig Dispense Auth. Provider   predniSONE (STERAPRED UNI-PAK 21 TAB) 10 MG (21) TBPK tablet Take by mouth daily. Take 6 tabs by mouth daily  for 2 days, then 5 tabs for 2 days, then 4 tabs for 2 days, then 3 tabs for 2 days, 2 tabs for 2 days, then 1 tab by mouth daily for 2  days 42 tablet Athenia Rys M, PA-C   betamethasone dipropionate (DIPROLENE) 0.05 % ointment Apply topically 2 (two) times daily. 30 g Kylia Grajales M, [place]      PDMP not reviewed this encounter.   Rushie Chestnut, Cordelia Poche 07/16/20 2030

## 2020-07-16 NOTE — ED Triage Notes (Signed)
Insect bites to bilateral legs and elbows.  Patient visiting in area.  Patient is using cortizone cream, benadryl spray

## 2021-03-23 ENCOUNTER — Telehealth: Payer: Self-pay | Admitting: *Deleted

## 2021-03-23 NOTE — Telephone Encounter (Signed)
° °  Pre-operative Risk Assessment    Patient Name: Shirley Morris  DOB: [birth year] MRN: [id number]      Request for Surgical Clearance    Procedure:   RIGHT TOTAL KNEE ARTHROPLASTY  Date of Surgery:  Clearance 05/14/21                                 Surgeon:  DR. MATTHEW OLIN Surgeon's Group or Practice Name:  [facility] Phone number:  [phone] Fax number:  [phone] ATTN: Judeen Hammans WILLS   Type of Clearance Requested:   - Medical    Type of Anesthesia:  Spinal   Additional requests/questions:    Jiles Prows   03/23/2021, 6:12 PM

## 2021-03-25 NOTE — Telephone Encounter (Signed)
Pt agreeable to plan of care for pre op clearance. Pt thanked me for the help. Pt seeing Tereso Newcomer, [facility] 04/14/21 @ 8:50. I will forward notes to [facility] for appt. Will send FYI to surgeon's office pt has appt  ?

## 2021-04-12 NOTE — Progress Notes (Deleted)
. ?  Cardiology Office Note:   ? ?Date:  04/12/2021  ? ?ID:  Shirley Morris, DOB Apr 21, 1970, MRN [id number] ? ?PCP:  Pcp, No  ?[facility] Providers ?Cardiologist:  Kristeen Miss, MD { ?Click to update primary MD,subspecialty MD or APP then REFRESH:1}  *** ?Referring MD: No ref. provider found  ? ?Chief Complaint:  No chief complaint on file. ?{Click here for Visit Info    :1}  ? ?Patient Profile: ?*** ?Prior CV Studies: ?*** ?{Select studies to display:26339}  ? ?History of Present Illness:   ?Shirley Morris is a 51 y.o. female with the above problem list.  She ***     ?   ?Past Medical History:  ?Diagnosis Date  ? Anemia   ? Chest pain   ? Negative stress echo in February of 2011  ? Hypertension   ? Injury of thumb, right   ? Migraine   ? Palpitations   ? SVT (supraventricular tachycardia) (HCC)   ? Tear of meniscus of right knee   ? ?Current Medications: ?No outpatient medications have been marked as taking for the 04/14/21 encounter (Appointment) with Beatrice Lecher, PA-C.  ?  ?Allergies:   Penicillins  ? ?Social History  ? ?Tobacco Use  ? Smoking status: Never  ? Smokeless tobacco: Never  ?Vaping Use  ? Vaping Use: Never used  ?Substance Use Topics  ? Alcohol use: No  ? Drug use: No  ?  ?Family Hx: ?The patient's family history includes Colon cancer in her cousin and maternal uncle; Coronary artery disease in her father; Diabetes in her father; Hypertension in her father. ? ?ROS  ? ?EKGs/Labs/Other Test Reviewed:   ? ?EKG:  EKG is *** ordered today.  The ekg ordered today demonstrates *** ? ?Recent Labs: ?07/08/2020: BUN 9; Creatinine, Ser 1.00; Potassium 3.8; Sodium 137  ? ?Recent Lipid Panel ?No results for input(s): CHOL, TRIG, HDL, VLDL, LDLCALC, LDLDIRECT in the last 8760 hours.  ? ?Risk Assessment/Calculations:   ?{Does this patient have ATRIAL FIBRILLATION?[phone]} ?    ?Physical Exam:   ? ?VS:  There were no vitals taken for this visit.   ? ?Wt Readings from Last 3 Encounters:   ?07/08/20 226 lb 10.1 oz (102.8 kg)  ?03/14/20 236 lb 3.2 oz (107.1 kg)  ?01/29/19 277 lb 1.9 oz (125.7 kg)  ?  ?Physical Exam *** ?    ?ASSESSMENT & PLAN:   ?No problem-specific Assessment & Plan notes found for this encounter. ?  ?     ?{Are you ordering a CV Procedure (e.g. stress test, cath, DCCV, TEE, etc)?   Press F2        :[id number]}  ?Dispo:  No follow-ups on file.  ? ?Medication Adjustments/Labs and Tests Ordered: ?Current medicines are reviewed at length with the patient today.  Concerns regarding medicines are outlined above.  ?Tests Ordered: ?No orders of the defined types were placed in this encounter. ? ?Medication Changes: ?No orders of the defined types were placed in this encounter. ? ?Signed, ?Tereso Newcomer, PA-C  ?04/12/2021 10:08 PM    ?[facility] ?[address] [place], [place], [place]  [address] ?Phone: [phone]; Fax: [phone]  ?

## 2021-04-13 NOTE — Progress Notes (Addendum)
Office Visit    Patient Name: Shirley Morris Date of Encounter: 04/13/2021  Primary Care Provider:  Pcp, No Primary Cardiologist:  Kristeen Miss, MD Primary Electrophysiologist: None Chief Complaint    Shirley Morris is a 51 y.o. female presents today for preoperative clearance  History of Present Illness    Shirley Morris is a 51 y.o. female with PMH of HTN, SVT, obesity s/p gastric bypass(has loss 74 pounds), anemia.  Myoview completed for complaints of atypical chest pain in 2015 with mild anterior ischemia noted, cardiac catheterization was performed by Dr. Swaziland revealing normal coronary arteries.  Echo performed 3/20 with LVEF 60-65%, mild LV wall thickness, normal tricuspid valve, mild thickened aortic valve, normal LV function.  CT angiogram completed 2/21 revealed normal coronaries and calcium score of 0, no evidence CAD.  Patient was last seen in office by Dr. Elease Hashimoto on 2/22 for complaint of hypertension.  Since last being seen in our clinic Shirley Morris reports doing well.  she currently denies chest pain, palpitations, dyspnea, PND, orthopnea, nausea, vomiting, dizziness, syncope, edema, weight gain, or early satiety.  She is here for preoperative clearance for total knee arthroplasty.  She has been limited with activity since her knee issues have occurred.  She plans to increase her activity once her procedure is complete.  She does endorse some dizziness from standing, however this is mainly associated with her beta-blocker and is not sustained.  She is tolerating her current antihypertensive well and blood pressure today 106/64 with heart rate of 61 bpm.  Past Medical History    Past Medical History:  Diagnosis Date   Anemia    Chest pain    Negative stress echo in February of 2011   Hypertension    Injury of thumb, right    Migraine    Palpitations    SVT (supraventricular tachycardia) (HCC)    Tear of meniscus of right knee    Past  Surgical History:  Procedure Laterality Date   BREAST BIOPSY Right 07/08/2020   Procedure: EXCISION OF RIGHT BREAST MASS;  Surgeon: Emelia Loron, MD;  Location: [facility];  Service: General;  Laterality: Right;   CESAREAN SECTION     Gastic bypass surgery by laparocopy  2007   KNEE ARTHROSCOPY  2011   LAPAROSCOPIC ASSISTED VAGINAL HYSTERECTOMY Right 12/17/2013   Procedure: LAPAROSCOPIC ASSISTED VAGINAL HYSTERECTOMY WITH RIGHT SALPINGO OOPHERECTOMY ;  Surgeon: Hal Morales, MD;  Location: [facility];  Service: Gynecology;  Laterality: Right;   LEFT HEART CATHETERIZATION WITH CORONARY ANGIOGRAM N/A 12/11/2013   Procedure: LEFT HEART CATHETERIZATION WITH CORONARY ANGIOGRAM;  Surgeon: Peter M Swaziland, MD;  [facility];  Service: Cardiovascular;  Laterality: N/A;   US ECHOCARDIOGRAPHY  11/26/2008   EF 55-60%    Allergies  Allergies  Allergen Reactions   Penicillins     Home Medications    Current Outpatient Medications  Medication Sig Dispense Refill   Ascorbic Acid (VITAMIN C) 500 MG CAPS Take 1 capsule by mouth daily.     betamethasone dipropionate (DIPROLENE) 0.05 % ointment Apply topically 2 (two) times daily. 30 g 0   calcium citrate (CALCITRATE - DOSED IN MG ELEMENTAL CALCIUM) 950 MG tablet Take 1 tablet by mouth daily.     celecoxib (CELEBREX) 200 MG capsule Take 200 mg by mouth daily.     cetirizine (ZYRTEC) 10 MG tablet Take 10 mg by mouth daily.      cyclobenzaprine (FLEXERIL) 5 MG tablet  Take 1 tablet by mouth as needed.     fluocinonide cream (LIDEX) 0.05 % Apply 1 application topically daily as needed. Eczema  0   imipramine (TOFRANIL) 25 MG tablet Take 75 mg by mouth at bedtime.     metoprolol succinate (TOPROL XL) 25 MG 24 hr tablet Take 1 tablet (25 mg total) by mouth daily. 90 tablet 3   Multiple Vitamin (MULTIVITAMIN) tablet Take 1 tablet by mouth daily.     predniSONE (STERAPRED UNI-PAK 21 TAB) 10 MG (21) TBPK tablet Take by mouth daily.  Take 6 tabs by mouth daily  for 2 days, then 5 tabs for 2 days, then 4 tabs for 2 days, then 3 tabs for 2 days, 2 tabs for 2 days, then 1 tab by mouth daily for 2 days 42 tablet 0   tiZANidine (ZANAFLEX) 4 MG tablet TAKE 1 TABLET (4 MG TOTAL) BY MOUTH EVERY SIX (6) HOURS AS NEEDED.     triamterene-hydrochlorothiazide (MAXZIDE-25) 37.5-25 MG tablet Take 0.5 tablets by mouth daily. 45 tablet 3   UNABLE TO FIND Take 500 mg by mouth daily. Tumeric     Vitamin D, Ergocalciferol, (DRISDOL) 1.25 MG (50000 UT) CAPS capsule Take 50,000 Units by mouth every 7 (seven) days.      No current facility-administered medications for this visit.     Review of Systems  Please see the history of present illness.    (+) Right knee pain  All other systems reviewed and are otherwise negative except as noted above.  Physical Exam    Wt Readings from Last 3 Encounters:  07/08/20 226 lb 10.1 oz (102.8 kg)  03/14/20 236 lb 3.2 oz (107.1 kg)  01/29/19 277 lb 1.9 oz (125.7 kg)   ZO:XWRUE were no vitals filed for this visit.,There is no height or weight on file to calculate BMI.  GEN: Well nourished, well developed, in no acute distress. Neck: Supple, no JVD, carotid bruits, or masses. Cardiac: S1,S2,RRR, no murmurs, rubs, or gallops. No clubbing, cyanosis, no edema.  Radials/PT 2+ and equal bilaterally.  Respiratory:  Respirations regular and unlabored, clear to auscultation bilaterally. MS: no deformity or atrophy. Skin: warm and dry, no rash. Neuro:  Strength and sensation are intact. Psych: Normal affect.  EKG/LABS/Other Studies Reviewed    ECG personally reviewed by me today -normal sinus rhythm with PACs rate of 85- no acute changes.  Risk Assessment/Calculations:    Lab Results  Component Value Date   WBC 9.0 12/19/2013   HGB 13.3 04/29/2014   HCT 39.0 04/29/2014   MCV 91.2 12/19/2013   PLT 278 12/19/2013   Lab Results  Component Value Date   CREATININE 1.00 07/08/2020   BUN 9 07/08/2020    NA 137 07/08/2020   K 3.8 07/08/2020   CL 99 07/08/2020   CO2 30 07/08/2020   Lab Results  Component Value Date   ALT 15 08/24/2011   AST 17 08/24/2011   ALKPHOS 89 08/24/2011   BILITOT 0.2 (L) 08/24/2011   No results found for: CHOL, HDL, LDLCALC, LDLDIRECT, TRIG, CHOLHDL  No results found for: HGBA1C  Assessment & Plan    1.  Preoperative clearance: According to the Revised Cardiac Risk Index (RCRI), her Perioperative Risk of Major Cardiac Event is (%): 0.4. Her Functional Capacity in METs is: 5.07 according to the Duke Activity Status Index (DASI).  Therefore patient is at acceptable risk to proceed with total knee arthroplasty with no further testing.  2.  Hypertension: -Blood pressure  well controlled today at 118/78 -Stable with no anginal symptoms. No indication for ischemic evaluation.   -Continue triamterene-HCTZ 37.5-25 -Toprol-XL 25 mg -Low-sodium heart healthy diet and patient plans to advance activity as tolerated once knee surgery completed  3.  Supraventricular tachycardia: -Patient reports occasional palpitations but no sustained tachycardias -No recurrence of SVT, patient sinus rhythm with PACs today -Continue Toprol XL 25 mg  Disposition: Follow-up with Kristeen Miss, MD 1 year     Medication Adjustments/Labs and Tests Ordered: Current medicines are reviewed at length with the patient today.  Concerns regarding medicines are outlined above.  Tests Ordered: No orders of the defined types were placed in this encounter.  Medication Changes: No orders of the defined types were placed in this encounter.  Signed, Napoleon Form, Leodis Rains, NP 04/13/2021, 12:06 PM [facility]  Notice: This dictation was prepared with Dragon dictation along with smaller phrase technology. Any transcriptional errors that result from this process are unintentional and may not be corrected upon review.   I spent 63 minutes examining this patient, reviewing  medications, and using patient centered shared decision making involving her cardiac care.  Prior to her visit I spent greater than 20 minutes reviewing her past medical history,  medications, and prior cardiac tests.

## 2021-04-14 ENCOUNTER — Ambulatory Visit (INDEPENDENT_AMBULATORY_CARE_PROVIDER_SITE_OTHER): Payer: BC Managed Care – PPO | Admitting: Nurse Practitioner

## 2021-04-14 ENCOUNTER — Encounter: Payer: Self-pay | Admitting: Nurse Practitioner

## 2021-04-14 ENCOUNTER — Other Ambulatory Visit: Payer: Self-pay

## 2021-04-14 VITALS — BP 118/78 | HR 85 | Ht 65.0 in | Wt 238.4 lb

## 2021-04-14 DIAGNOSIS — I471 Supraventricular tachycardia, unspecified: Secondary | ICD-10-CM

## 2021-04-14 DIAGNOSIS — I10 Essential (primary) hypertension: Secondary | ICD-10-CM | POA: Diagnosis not present

## 2021-04-14 DIAGNOSIS — Z01818 Encounter for other preprocedural examination: Secondary | ICD-10-CM

## 2021-04-14 MED ORDER — TRIAMTERENE-HCTZ 37.5-25 MG PO TABS: 0.5000 | ORAL_TABLET | Freq: Every day | ORAL | 3 refills | Status: DC

## 2021-04-14 MED ORDER — METOPROLOL SUCCINATE ER 25 MG PO TB24: 25.0000 mg | ORAL_TABLET | Freq: Every day | ORAL | 3 refills | Status: DC

## 2021-04-14 NOTE — Patient Instructions (Signed)
Medication Instructions:  Your physician recommends that you continue on your current medications as directed. Please refer to the Current Medication list given to you today.  *If you need a refill on your cardiac medications before your next appointment, please call your pharmacy*   Lab Work: None ordered  If you have labs (blood work) drawn today and your tests are completely normal, you will receive your results only by: MyChart Message (if you have MyChart) OR A paper copy in the mail If you have any lab test that is abnormal or we need to change your treatment, we will call you to review the results.   Testing/Procedures: None ordered   Follow-Up: At [facility], you and your health needs are our priority.  As part of our continuing mission to provide you with exceptional heart care, we have created designated Provider Care Teams.  These Care Teams include your primary Cardiologist (physician) and Advanced Practice Providers (APPs -  Physician Assistants and Nurse Practitioners) who all work together to provide you with the care you need, when you need it.  We recommend signing up for the patient portal called "MyChart".  Sign up information is provided on this After Visit Summary.  MyChart is used to connect with patients for Virtual Visits (Telemedicine).  Patients are able to view lab/test results, encounter notes, upcoming appointments, etc.  Non-urgent messages can be sent to your provider as well.   To learn more about what you can do with MyChart, go to [email or web address].    Your next appointment:   12 month(s)  The format for your next appointment:   In Person  Provider:   Philip Nahser, MD     Other Instructions   

## 2021-05-05 NOTE — Patient Instructions (Addendum)
2 VISITORS  ARE ALLOWED TO COME WITH YOU AND STAY IN THE WAITING ROOM ONLY DURING PRE OP AND PROCEDURE.   ? ?**NO VISITORS ARE ALLOWED IN THE SHORT STAY AREA OR RECOVERY ROOM!!** ? ?IF YOU WILL BE ADMITTED INTO THE HOSPITAL YOU ARE ALLOWED ONLY 4SUPPORT PEOPLE DURING VISITATION HOURS ONLY (7 AM -8PM)   ?The support person(s) must pass our screening, gel in and out, and wear a mask at all times, including in the patient?s room. ?Patients must also wear a mask when staff or their support person are in the room. ?Visitors GUEST BADGE MUST BE WORN VISIBLY  ?One adult visitor may remain with you overnight and MUST be in the room by 8 P.M. ?  ? ? Your procedure is scheduled on: 05/14/21 ? ? Report to [facility] Main Entrance ? ?  Report to admitting at  7:25 AM ? ? Call this number if you have problems the morning of surgery [phone] ? ? Do not eat food :After Midnight. ? ? After Midnight you may have the following liquids until 7:00_AM DAY OF SURGERY ? ?Water ?Black Coffee (sugar ok, NO MILK/CREAM OR CREAMERS)  ?Tea (sugar ok, NO MILK/CREAM OR CREAMERS) regular and decaf                             ?Plain Jell-O (NO RED)                                           ?Fruit ices (not with fruit pulp, NO RED)                                     ?Popsicles (NO RED)                                                                  ?Juice: apple, WHITE grape, WHITE cranberry ?Sports drinks like Gatorade (NO RED) ?Clear broth(vegetable,chicken,beef) ? ?             ? ?  ?  ?The day of surgery:  ?Drink ONE (1) Pre-Surgery Clear Ensure at  6:45 AM the morning of surgery. Drink in one sitting. Do not sip.  ?This drink was given to you during your hospital  ?pre-op appointment visit. ?Nothing else to drink after completing the  ?Pre-Surgery Clear Ensure at 7:00 AM ?  ?       If you have questions, please contact your surgeon?s office. ? ? ?  ?  ?Oral Hygiene is also important to reduce your risk of infection.                                     ?Remember - BRUSH YOUR TEETH THE MORNING OF SURGERY WITH YOUR REGULAR TOOTHPASTE ? ? Do NOT smoke after Midnight ? ? Take these medicines the morning of surgery with A SIP OF WATER: Metoprolol ? ? ?                  ?  You may not have any metal on your body including hair pins, jewelry, and body piercing ? ?           Do not wear make-up, lotions, powders, perfumes, or deodorant ? ?Do not wear nail polish including gel and S&S, artificial/acrylic nails, or any other type of covering on natural nails including finger and toenails. If you have artificial nails, gel coating, etc. that needs to be removed by a nail salon please have this removed prior to surgery or surgery may need to be canceled/ delayed if the surgeon/ anesthesia feels like they are unable to be safely monitored.  ? ?Do not shave  48 hours prior to surgery.  ? ? ? Do not bring valuables to the hospital. [facility] IS NOT ?            RESPONSIBLE   FOR VALUABLES. ? ? Contacts, dentures or bridgework may not be worn into surgery. ? ? Bring small overnight bag day of surgery. ? . ? ? Special Instructions: Bring a copy of your healthcare power of attorney and living will documents the day of surgery if you haven't scanned them before. ? ?            Please read over the following fact sheets you were given: IF YOU HAVE QUESTIONS ABOUT YOUR PRE-OP INSTRUCTIONS PLEASE CALL [phone] ? ?   [facility] - Preparing for Surgery ?Before surgery, you can play an important role.  Because skin is not sterile, your skin needs to be as free of germs as possible.  You can reduce the number of germs on your skin by washing with CHG (chlorahexidine gluconate) soap before surgery.  CHG is an antiseptic cleaner which kills germs and bonds with the skin to continue killing germs even after washing. ?Please DO NOT use if you have an allergy to CHG or antibacterial soaps.  If your skin becomes reddened/irritated stop using the CHG and  inform your nurse when you arrive at Short Stay. ?Do not shave (including legs and underarms) for at least 48 hours prior to the first CHG shower.   ?Please follow these instructions carefully: ? 1.  Shower with CHG Soap the night before surgery and the  morning of Surgery. ? 2.  If you choose to wash your hair, wash your hair first as usual with your  normal  shampoo. ? 3.  After you shampoo, rinse your hair and body thoroughly to remove the  shampoo.                    ?        4.  Use CHG as you would any other liquid soap.  You can apply chg directly  to the skin and wash  ?                     Gently with a scrungie or clean washcloth. ? 5.  Apply the CHG Soap to your body ONLY FROM THE NECK DOWN.   Do not use on face/ open      ?                     Wound or open sores. Avoid contact with eyes, ears mouth and genitals (private parts).  ?                     Engineering geologistWash face,  Genitals (private parts) with your normal soap. ?  6.  Wash thoroughly, paying special attention to the area where your surgery  will be performed. ? 7.  Thoroughly rinse your body with warm water from the neck down. ? 8.  DO NOT shower/wash with your normal soap after using and rinsing off  the CHG Soap. ?               9.  Pat yourself dry with a clean towel. ?           10.  Wear clean pajamas. ?           11.  Place clean sheets on your bed the night of your first shower and do not  sleep with pets. ?Day of Surgery : ?Do not apply any lotions/deodorants the morning of surgery.  Please wear clean clothes to the hospital/surgery center. ? ?FAILURE TO FOLLOW THESE INSTRUCTIONS MAY RESULT IN THE CANCELLATION OF YOUR SURGERY ? ? ? ?Incentive Spirometer ? ?An incentive spirometer is a tool that can help keep your lungs clear and active. This tool measures how well you are filling your lungs with each breath. Taking long deep breaths may help reverse or decrease the chance of developing breathing (pulmonary) problems (especially  infection) following: ?A long period of time when you are unable to move or be active. ?BEFORE THE PROCEDURE  ?If the spirometer includes an indicator to show your best effort, your nurse or respiratory therapist will set it to a desired goal. ?If possible, sit up straight or lean slightly forward. Try not to slouch. ?Hold the incentive spirometer in an upright position. ?INSTRUCTIONS FOR USE  ?Sit on the edge of your bed if possible, or sit up as far as you can in bed or on a chair. ?Hold the incentive spirometer in an upright position. ?Breathe out normally. ?Place the mouthpiece in your mouth and seal your lips tightly around it. ?Breathe in slowly and as deeply as possible, raising the piston or the ball toward the top of the column. ?Hold your breath for 3-5 seconds or for as long as possible. Allow the piston or ball to fall to the bottom of the column. ?Remove the mouthpiece from your mouth and breathe out normally. ?Rest for a few seconds and repeat Steps 1 through 7 at least 10 times every 1-2 hours when you are awake. Take your time and take a few normal breaths between deep breaths. ?The spirometer may include an indicator to show your best effort. Use the indicator as a goal to work toward during each repetition. ?After each set of 10 deep breaths, practice coughing to be sure your lungs are clear. If you have an incision (the cut made at the time of surgery), support your incision when coughing by placing a pillow or rolled up towels firmly against it. ?Once you are able to get out of bed, walk around indoors and cough well. You may stop using the incentive spirometer when instructed by your caregiver.  ?RISKS AND COMPLICATIONS ?Take your time so you do not get dizzy or light-headed. ?If you are in pain, you may need to take or ask for pain medication before doing incentive spirometry. It is harder to take a deep breath if you are having pain. ?AFTER USE ?Rest and breathe slowly and easily. ?It can be  helpful to keep track of a log of your progress. Your caregiver can provide you with a simple table to help with this. ?If you are using the spirometer at home, follow  these instructions: ?SEEK MEDICAL CARE IF:  ?Y

## 2021-05-07 ENCOUNTER — Encounter (HOSPITAL_COMMUNITY): Payer: Self-pay

## 2021-05-07 ENCOUNTER — Encounter (HOSPITAL_COMMUNITY)
Admission: RE | Admit: 2021-05-07 | Discharge: 2021-05-07 | Disposition: A | Discharge status: Home / Self Care | Payer: BC Managed Care – PPO | Source: Ambulatory Visit | Attending: Orthopedic Surgery | Admitting: Orthopedic Surgery

## 2021-05-07 ENCOUNTER — Other Ambulatory Visit: Payer: Self-pay

## 2021-05-07 VITALS — HR 65 | Temp 98.5°F | Resp 18 | Ht 65.0 in | Wt 234.0 lb

## 2021-05-07 DIAGNOSIS — I10 Essential (primary) hypertension: Secondary | ICD-10-CM | POA: Diagnosis not present

## 2021-05-07 DIAGNOSIS — Z01818 Encounter for other preprocedural examination: Secondary | ICD-10-CM

## 2021-05-07 DIAGNOSIS — M1711 Unilateral primary osteoarthritis, right knee: Secondary | ICD-10-CM | POA: Diagnosis not present

## 2021-05-07 DIAGNOSIS — Z01812 Encounter for preprocedural laboratory examination: Secondary | ICD-10-CM | POA: Insufficient documentation

## 2021-05-07 LAB — COMPREHENSIVE METABOLIC PANEL
ALT: 19 U/L (ref 0–44)
AST: 19 U/L (ref 15–41)
Albumin: 3.8 g/dL (ref 3.5–5.0)
Alkaline Phosphatase: 72 U/L (ref 38–126)
Anion gap: 7 (ref 5–15)
BUN: 13 mg/dL (ref 6–20)
CO2: 29 mmol/L (ref 22–32)
Calcium: 9.1 mg/dL (ref 8.9–10.3)
Chloride: 104 mmol/L (ref 98–111)
Creatinine, Ser: 0.87 mg/dL (ref 0.44–1.00)
GFR, Estimated: >60 mL/min (ref >60)
Glucose, Bld: 79 mg/dL (ref 70–99)
Potassium: 3.5 mmol/L (ref 3.5–5.1)
Sodium: 140 mmol/L (ref 135–145)
Total Bilirubin: 0.3 mg/dL (ref 0.3–1.2)
Total Protein: 7.1 g/dL (ref 6.5–8.1)

## 2021-05-07 LAB — CBC
HCT: 39.3 % (ref 36.0–46.0)
Hemoglobin: 12.9 g/dL (ref 12.0–15.0)
MCH: 30.4 pg (ref 26.0–34.0)
MCHC: 32.8 g/dL (ref 30.0–36.0)
MCV: 92.7 fL (ref 80.0–100.0)
Platelets: 305 10*3/uL (ref 150–400)
RBC: 4.24 MIL/uL (ref 3.87–5.11)
RDW: 14.1 % (ref 11.5–15.5)
WBC: 6 10*3/uL (ref 4.0–10.5)
nRBC: 0 % (ref 0.0–0.2)

## 2021-05-07 LAB — SURGICAL PCR SCREEN
MRSA, PCR: POSITIVE — AB
Staphylococcus aureus: POSITIVE — AB

## 2021-05-07 NOTE — Progress Notes (Signed)
Anesthesia note: ? ?Bowel prep reminder:NA ? ?PCP - Dr. Noreene Filbert ?Cardiologist -Dr. Katherina Right ?Other-  ? ?Chest x-ray - no ?EKG - 04/14/21-epic ?Stress Test - 2015-epic ?ECHO - 04/03/18-epic ?Cardiac Cath - 2015 ? ?Pacemaker/ICD device last checked:NA ? ?Sleep Study - no ?CPAP -  ? ?Pt is pre diabetic-No Pt takes Ozempic for wt loss ?Fasting Blood Sugar -  ?Checks Blood Sugar _____ ? ?Blood Thinner:NA ?Blood Thinner Instructions: ?Aspirin Instructions: ?Last Dose: ? ?Anesthesia review: no ? ?Patient denies shortness of breath, fever, cough and chest pain at PAT appointment ?Pt gets winded because of pain and BMI of 38.9 ? ?Patient verbalized understanding of instructions that were given to them at the PAT appointment. Patient was also instructed that they will need to review over the PAT instructions again at home before surgery. yes ?

## 2021-05-08 NOTE — Progress Notes (Signed)
Anesthesia Chart Review: ? ? Case: [id number] Date/Time: 05/14/21 0950  ? Procedure: TOTAL KNEE ARTHROPLASTY (Right: Knee)  ? Anesthesia type: Spinal  ? Pre-op diagnosis: Right knee osteoarthritis  ? Location: [facility] ROOM 09 / [facility]  ? Surgeons: Durene Romans, MD  ? ?  ? ? ?DISCUSSION: ?Pt is 51 years old with hx SVT, HTN, obestity (s/p gastric bypass).  ? ?VS: Pulse 65   Temp 36.9 ?C (Oral)   Resp 18   Ht 5\' 5"  (1.651 m)   Wt 106.1 kg   SpO2 99%   BMI 38.94 kg/m?  ? ?PROVIDERS: ?- PCP is , MD ?- Cardiologist is Binnie Kand, MD. Cleared for surgery at acceptable risk at last office visit 04/14/21 with 04/16/21, NP ? ? ?LABS: Labs reviewed: Acceptable for surgery. ?(all labs ordered are listed, but only abnormal results are displayed) ? ?Labs Reviewed  ?SURGICAL PCR SCREEN - Abnormal; Notable for the following components:  ?    Result Value  ? MRSA, PCR POSITIVE (*)   ? Staphylococcus aureus POSITIVE (*)   ? All other components within normal limits  ?CBC  ?COMPREHENSIVE METABOLIC PANEL  ?TYPE AND SCREEN  ? ? ? ?EKG 04/14/21: NS with sinus arrhythmia ?  ? ?CV: ?CT coronary morphology 03/16/19:  ?1. Coronary calcium score of 0. This was 0 percentile for age and sex matched control. ?2. Normal coronary origin with right dominance. ?3. No evidence of CAD. ? ?Echo 04/03/18:  ?1. The left ventricle has normal systolic function with an ejection fraction of 60-65%. The cavity size was normal. There is mildly increased left ventricular wall thickness. Left ventricular diastolic parameters were normal.  ? 2. The tricuspid valve is grossly normal.  ? 3. The aortic valve is tricuspid Mild thickening of the aortic valve.  ? 4. Normal LV function; no significant valvular disease.  ? ?Cardiac cath 12/11/13:  ?1. Normal coronary anatomy ?2. Normal LV function. ? ? ?Past Medical History:  ?Diagnosis Date  ? Anemia   ? Chest pain   ? Negative stress echo in February of 2011  ? Hypertension   ? Injury of thumb, right    ? Migraine   ? Palpitations   ? SVT (supraventricular tachycardia) (HCC)   ? Tear of meniscus of right knee   ? ? ?Past Surgical History:  ?Procedure Laterality Date  ? BREAST BIOPSY Right 07/08/2020  ? Procedure: EXCISION OF RIGHT BREAST MASS;  Surgeon: 07/10/2020, MD;  Location: [facility];  Service: General;  Laterality: Right;  ? CESAREAN SECTION  1996  ? Gastic bypass surgery by laparocopy  01/25/2005  ? KNEE ARTHROSCOPY Right 01/25/2009  ? x4  ? LAPAROSCOPIC ASSISTED VAGINAL HYSTERECTOMY Right 12/17/2013  ? Procedure: LAPAROSCOPIC ASSISTED VAGINAL HYSTERECTOMY WITH RIGHT SALPINGO OOPHERECTOMY ;  Surgeon: 12/19/2013, MD;  Location: [facility];  Service: Gynecology;  Laterality: Right;  ? LEFT HEART CATHETERIZATION WITH CORONARY ANGIOGRAM N/A 12/11/2013  ? Procedure: LEFT HEART CATHETERIZATION WITH CORONARY ANGIOGRAM;  Surgeon: Peter M 12/13/2013, MD;  Location: [facility] CATH LAB;  Service: Cardiovascular;  Laterality: N/A;  ? [facility] ECHOCARDIOGRAPHY  11/26/2008  ? EF 55-60%  ? ? ?MEDICATIONS: ? Ascorbic Acid (VITAMIN C) 500 MG CAPS  ? CALCIUM-VITAMIN D PO  ? Cholecalciferol (VITAMIN D3) 25 MCG (1000 UT) CHEW  ? cyclobenzaprine (FLEXERIL) 5 MG tablet  ? ferrous sulfate 325 (65 FE) MG tablet  ? fluocinonide cream (LIDEX) 0.05 %  ? imipramine (TOFRANIL) 25 MG tablet  ?  meloxicam (MOBIC) 15 MG tablet  ? metoprolol succinate (TOPROL XL) 25 MG 24 hr tablet  ? Multiple Vitamin (MULTIVITAMIN) tablet  ? OVER THE COUNTER MEDICATION  ? OVER THE COUNTER MEDICATION  ? triamterene-hydrochlorothiazide (MAXZIDE-25) 37.5-25 MG tablet  ? Vitamin D, Ergocalciferol, (DRISDOL) 1.25 MG (50000 UT) CAPS capsule  ? WEGOVY 1.7 MG/0.75ML SOAJ  ? ?No current facility-administered medications for this encounter.  ? ? ?If no changes, I anticipate pt can proceed with surgery as scheduled.  ? ?Rica Mast, PhD, FNP-BC ?[facility]/Anesthesiology ?Phone: [phone] ?05/08/2021 11:51 AM ? ? ? ? ? ? ?

## 2021-05-08 NOTE — Anesthesia Preprocedure Evaluation (Addendum)
Anesthesia Evaluation  ?Patient identified by MRN, date of birth, ID band ?Patient awake ? ? ? ?Reviewed: ?Allergy & Precautions, NPO status , Patient's Chart, lab work & pertinent test results ? ?Airway ?Mallampati: II ? ?TM Distance: >3 FB ?Neck ROM: Full ? ? ? Dental ?no notable dental hx. ? ?  ?Pulmonary ?neg pulmonary ROS,  ?  ?Pulmonary exam normal ?breath sounds clear to auscultation ? ? ? ? ? ? Cardiovascular ?hypertension, Pt. on medications ?negative cardio ROS ?Normal cardiovascular exam ?Rhythm:Regular Rate:Normal ? ? ?  ?Neuro/Psych ? Headaches, negative psych ROS  ? GI/Hepatic ?negative GI ROS, Neg liver ROS,   ?Endo/Other  ?negative endocrine ROS ? Renal/GU ?negative Renal ROS  ?negative genitourinary ?  ?Musculoskeletal ?negative musculoskeletal ROS ?(+)  ? Abdominal ?(+) + obese,   ?Peds ?negative pediatric ROS ?(+)  Hematology ?negative hematology ROS ?(+)   ?Anesthesia Other Findings ? ? Reproductive/Obstetrics ?negative OB ROS ? ?  ? ? ? ? ? ? ? ? ? ? ? ? ? ?  ?  ? ? ? ? ? ? ? ?Anesthesia Physical ?Anesthesia Plan ? ?ASA: 2 ? ?Anesthesia Plan: Spinal  ? ?Post-op Pain Management: Regional block* and Minimal or no pain anticipated  ? ?Induction: Intravenous ? ?PONV Risk Score and Plan: 2 and Ondansetron, Midazolam, Treatment may vary due to age or medical condition and Propofol infusion ? ?Airway Management Planned: Simple Face Mask ? ?Additional Equipment:  ? ?Intra-op Plan:  ? ?Post-operative Plan:  ? ?Informed Consent: I have reviewed the patients History and Physical, chart, labs and discussed the procedure including the risks, benefits and alternatives for the proposed anesthesia with the patient or authorized representative who has indicated his/her understanding and acceptance.  ? ? ? ?Dental advisory given ? ?Plan Discussed with: CRNA ? ?Anesthesia Plan Comments: (See APP note by Durel Salts, FNP )  ? ? ? ? ? ?Anesthesia Quick Evaluation ? ?

## 2021-05-14 ENCOUNTER — Other Ambulatory Visit: Payer: Self-pay

## 2021-05-14 ENCOUNTER — Ambulatory Visit (HOSPITAL_COMMUNITY): Payer: BC Managed Care – PPO | Admitting: Emergency Medicine

## 2021-05-14 ENCOUNTER — Encounter (HOSPITAL_COMMUNITY)
Admission: RE | Disposition: A | Discharge status: Home / Self Care | Payer: Self-pay | Source: Ambulatory Visit | Attending: Orthopedic Surgery

## 2021-05-14 ENCOUNTER — Observation Stay (HOSPITAL_COMMUNITY)
Admission: RE | Admit: 2021-05-14 | Discharge: 2021-05-16 | Disposition: A | Discharge status: Home / Self Care | Payer: BC Managed Care – PPO | Source: Ambulatory Visit | Attending: Orthopedic Surgery | Admitting: Orthopedic Surgery

## 2021-05-14 ENCOUNTER — Ambulatory Visit (HOSPITAL_COMMUNITY): Payer: BC Managed Care – PPO | Admitting: Certified Registered Nurse Anesthetist

## 2021-05-14 ENCOUNTER — Encounter (HOSPITAL_COMMUNITY): Payer: Self-pay | Admitting: Orthopedic Surgery

## 2021-05-14 DIAGNOSIS — M1711 Unilateral primary osteoarthritis, right knee: Secondary | ICD-10-CM | POA: Diagnosis present

## 2021-05-14 DIAGNOSIS — I10 Essential (primary) hypertension: Secondary | ICD-10-CM | POA: Diagnosis not present

## 2021-05-14 DIAGNOSIS — Z79899 Other long term (current) drug therapy: Secondary | ICD-10-CM | POA: Diagnosis not present

## 2021-05-14 DIAGNOSIS — Z96651 Presence of right artificial knee joint: Secondary | ICD-10-CM

## 2021-05-14 HISTORY — PX: TOTAL KNEE ARTHROPLASTY: SHX125

## 2021-05-14 LAB — TYPE AND SCREEN
ABO/RH(D): O POS
Antibody Screen: NEGATIVE

## 2021-05-14 SURGERY — ARTHROPLASTY, KNEE, TOTAL
Anesthesia: Spinal | Site: Knee | Laterality: Right

## 2021-05-14 MED ORDER — POLYETHYLENE GLYCOL 3350 17 G PO PACK: 17.0000 g | PACK | Freq: Every day | ORAL | Status: DC | PRN

## 2021-05-14 MED ORDER — OXYCODONE HCL 5 MG/5ML PO SOLN: 5.0000 mg | Freq: Once | ORAL | Status: DC | PRN

## 2021-05-14 MED ORDER — METOCLOPRAMIDE HCL 5 MG PO TABS
5.0000 mg | ORAL_TABLET | Freq: Three times a day (TID) | ORAL | Status: DC | PRN
  Administered 2021-05-14: 10 mg via ORAL
  Filled 2021-05-14: qty 2

## 2021-05-14 MED ORDER — CYCLOBENZAPRINE HCL 10 MG PO TABS
10.0000 mg | ORAL_TABLET | Freq: Three times a day (TID) | ORAL | Status: DC | PRN
  Administered 2021-05-15 (×2): 10 mg via ORAL
  Filled 2021-05-14 (×2): qty 1

## 2021-05-14 MED ORDER — STERILE WATER FOR IRRIGATION IR SOLN
Status: DC | PRN
  Administered 2021-05-14: 2000 mL

## 2021-05-14 MED ORDER — OXYCODONE HCL 5 MG PO TABS: 5.0000 mg | ORAL_TABLET | Freq: Once | ORAL | Status: DC | PRN

## 2021-05-14 MED ORDER — SODIUM CHLORIDE (PF) 0.9 % IJ SOLN
INTRAMUSCULAR | Status: DC | PRN
  Administered 2021-05-14: 30 mL

## 2021-05-14 MED ORDER — DIPHENHYDRAMINE HCL 12.5 MG/5ML PO ELIX: 12.5000 mg | ORAL_SOLUTION | ORAL | Status: DC | PRN

## 2021-05-14 MED ORDER — LACTATED RINGERS IV SOLN: INTRAVENOUS | Status: DC

## 2021-05-14 MED ORDER — PHENOL 1.4 % MT LIQD: 1.0000 | OROMUCOSAL | Status: DC | PRN

## 2021-05-14 MED ORDER — BUPIVACAINE IN DEXTROSE 0.75-8.25 % IT SOLN
INTRATHECAL | Status: DC | PRN
  Administered 2021-05-14: 2 mL via INTRATHECAL

## 2021-05-14 MED ORDER — KETOROLAC TROMETHAMINE 15 MG/ML IJ SOLN
15.0000 mg | Freq: Four times a day (QID) | INTRAMUSCULAR | Status: AC
  Administered 2021-05-14 – 2021-05-15 (×4): 15 mg via INTRAVENOUS
  Filled 2021-05-14 (×4): qty 1

## 2021-05-14 MED ORDER — ASPIRIN 81 MG PO CHEW
81.0000 mg | CHEWABLE_TABLET | Freq: Two times a day (BID) | ORAL | Status: DC
  Administered 2021-05-14 – 2021-05-16 (×4): 81 mg via ORAL
  Filled 2021-05-14 (×4): qty 1

## 2021-05-14 MED ORDER — TRANEXAMIC ACID-NACL 1000-0.7 MG/100ML-% IV SOLN
1000.0000 mg | Freq: Once | INTRAVENOUS | Status: AC
  Administered 2021-05-14: 1000 mg via INTRAVENOUS
  Filled 2021-05-14: qty 100

## 2021-05-14 MED ORDER — MENTHOL 3 MG MT LOZG: 1.0000 | LOZENGE | OROMUCOSAL | Status: DC | PRN

## 2021-05-14 MED ORDER — DEXAMETHASONE SODIUM PHOSPHATE 10 MG/ML IJ SOLN
INTRAMUSCULAR | Status: AC
Start: 2021-05-14 — End: ?
  Filled 2021-05-14: qty 1

## 2021-05-14 MED ORDER — LACTATED RINGERS IR SOLN
Status: DC | PRN
Start: 2021-05-14 — End: 2021-05-14
  Administered 2021-05-14: 1000 mL

## 2021-05-14 MED ORDER — PROPOFOL 1000 MG/100ML IV EMUL
INTRAVENOUS | Status: AC
  Filled 2021-05-14: qty 100

## 2021-05-14 MED ORDER — FENTANYL CITRATE PF 50 MCG/ML IJ SOSY
50.0000 ug | PREFILLED_SYRINGE | INTRAMUSCULAR | Status: DC
  Administered 2021-05-14: 100 ug via INTRAVENOUS
  Filled 2021-05-14: qty 2

## 2021-05-14 MED ORDER — DEXAMETHASONE SODIUM PHOSPHATE 10 MG/ML IJ SOLN
10.0000 mg | Freq: Once | INTRAMUSCULAR | Status: AC
  Administered 2021-05-15: 10 mg via INTRAVENOUS
  Filled 2021-05-14: qty 1

## 2021-05-14 MED ORDER — PROPOFOL 10 MG/ML IV BOLUS
INTRAVENOUS | Status: AC
  Filled 2021-05-14: qty 20

## 2021-05-14 MED ORDER — TRIAMTERENE-HCTZ 37.5-25 MG PO TABS
0.5000 | ORAL_TABLET | Freq: Every day | ORAL | Status: DC
  Administered 2021-05-15 – 2021-05-16 (×2): 0.5 via ORAL
  Filled 2021-05-14 (×2): qty 1

## 2021-05-14 MED ORDER — MIDAZOLAM HCL 5 MG/5ML IJ SOLN
INTRAMUSCULAR | Status: DC | PRN
  Administered 2021-05-14: 2 mg via INTRAVENOUS

## 2021-05-14 MED ORDER — ONDANSETRON HCL 4 MG/2ML IJ SOLN
INTRAMUSCULAR | Status: AC
  Filled 2021-05-14: qty 2

## 2021-05-14 MED ORDER — PROPOFOL 10 MG/ML IV BOLUS
INTRAVENOUS | Status: DC | PRN
  Administered 2021-05-14 (×2): 30 mg via INTRAVENOUS

## 2021-05-14 MED ORDER — BISACODYL 10 MG RE SUPP: 10.0000 mg | Freq: Every day | RECTAL | Status: DC | PRN

## 2021-05-14 MED ORDER — GENTAMICIN SULFATE 40 MG/ML IJ SOLN
400.0000 mg | Freq: Once | INTRAVENOUS | Status: AC
  Administered 2021-05-14: 400 mg via INTRAVENOUS
  Filled 2021-05-14: qty 10

## 2021-05-14 MED ORDER — ONDANSETRON HCL 4 MG PO TABS
4.0000 mg | ORAL_TABLET | Freq: Four times a day (QID) | ORAL | Status: DC | PRN
  Administered 2021-05-15 (×2): 4 mg via ORAL
  Filled 2021-05-14 (×2): qty 1

## 2021-05-14 MED ORDER — PROMETHAZINE HCL 25 MG/ML IJ SOLN: 6.2500 mg | INTRAMUSCULAR | Status: DC | PRN

## 2021-05-14 MED ORDER — PHENYLEPHRINE HCL-NACL 20-0.9 MG/250ML-% IV SOLN
INTRAVENOUS | Status: DC | PRN
  Administered 2021-05-14: 30 ug/min via INTRAVENOUS

## 2021-05-14 MED ORDER — HYDROMORPHONE HCL 2 MG PO TABS
3.0000 mg | ORAL_TABLET | ORAL | Status: DC | PRN
  Administered 2021-05-15 – 2021-05-16 (×4): 4 mg via ORAL
  Filled 2021-05-14 (×4): qty 2

## 2021-05-14 MED ORDER — HYDROMORPHONE HCL 1 MG/ML IJ SOLN: 0.2500 mg | INTRAMUSCULAR | Status: DC | PRN

## 2021-05-14 MED ORDER — POVIDONE-IODINE 10 % EX SWAB
2.0000 "application " | Freq: Once | CUTANEOUS | Status: AC
  Administered 2021-05-14: 2 via TOPICAL

## 2021-05-14 MED ORDER — VANCOMYCIN HCL IN DEXTROSE 1-5 GM/200ML-% IV SOLN
1000.0000 mg | Freq: Two times a day (BID) | INTRAVENOUS | Status: AC
  Administered 2021-05-14 – 2021-05-15 (×2): 1000 mg via INTRAVENOUS
  Filled 2021-05-14 (×2): qty 200

## 2021-05-14 MED ORDER — METOCLOPRAMIDE HCL 5 MG/ML IJ SOLN: 5.0000 mg | Freq: Three times a day (TID) | INTRAMUSCULAR | Status: DC | PRN

## 2021-05-14 MED ORDER — DOCUSATE SODIUM 100 MG PO CAPS
100.0000 mg | ORAL_CAPSULE | Freq: Two times a day (BID) | ORAL | Status: DC
  Administered 2021-05-14 – 2021-05-16 (×4): 100 mg via ORAL
  Filled 2021-05-14 (×5): qty 1

## 2021-05-14 MED ORDER — TRANEXAMIC ACID-NACL 1000-0.7 MG/100ML-% IV SOLN
1000.0000 mg | INTRAVENOUS | Status: AC
  Administered 2021-05-14: 1000 mg via INTRAVENOUS
  Filled 2021-05-14: qty 100

## 2021-05-14 MED ORDER — MIDAZOLAM HCL 2 MG/2ML IJ SOLN
INTRAMUSCULAR | Status: AC
Start: 2021-05-14 — End: ?
  Filled 2021-05-14: qty 2

## 2021-05-14 MED ORDER — METOPROLOL SUCCINATE ER 25 MG PO TB24
25.0000 mg | ORAL_TABLET | Freq: Every day | ORAL | Status: DC
  Administered 2021-05-15 – 2021-05-16 (×2): 25 mg via ORAL
  Filled 2021-05-14 (×2): qty 1

## 2021-05-14 MED ORDER — ROPIVACAINE HCL 5 MG/ML IJ SOLN
INTRAMUSCULAR | Status: DC | PRN
  Administered 2021-05-14: 20 mL via PERINEURAL

## 2021-05-14 MED ORDER — ONDANSETRON HCL 4 MG/2ML IJ SOLN
4.0000 mg | Freq: Four times a day (QID) | INTRAMUSCULAR | Status: DC | PRN
  Administered 2021-05-14: 4 mg via INTRAVENOUS
  Filled 2021-05-14: qty 2

## 2021-05-14 MED ORDER — VANCOMYCIN HCL 1500 MG/300ML IV SOLN
1500.0000 mg | INTRAVENOUS | Status: AC
  Administered 2021-05-14: 1500 mg via INTRAVENOUS
  Filled 2021-05-14 (×2): qty 300

## 2021-05-14 MED ORDER — PROPOFOL 500 MG/50ML IV EMUL
INTRAVENOUS | Status: DC | PRN
  Administered 2021-05-14: 100 ug/kg/min via INTRAVENOUS

## 2021-05-14 MED ORDER — ACETAMINOPHEN 325 MG PO TABS: 325.0000 mg | ORAL_TABLET | Freq: Four times a day (QID) | ORAL | Status: DC | PRN

## 2021-05-14 MED ORDER — KETOROLAC TROMETHAMINE 30 MG/ML IJ SOLN
INTRAMUSCULAR | Status: DC | PRN
  Administered 2021-05-14: 30 mg

## 2021-05-14 MED ORDER — HYDROMORPHONE HCL 2 MG PO TABS
1.0000 mg | ORAL_TABLET | ORAL | Status: DC | PRN
  Administered 2021-05-14 – 2021-05-15 (×6): 2 mg via ORAL
  Filled 2021-05-14 (×6): qty 1

## 2021-05-14 MED ORDER — 0.9 % SODIUM CHLORIDE (POUR BTL) OPTIME
TOPICAL | Status: DC | PRN
  Administered 2021-05-14: 1000 mL

## 2021-05-14 MED ORDER — DEXAMETHASONE SODIUM PHOSPHATE 10 MG/ML IJ SOLN
8.0000 mg | Freq: Once | INTRAMUSCULAR | Status: AC
  Administered 2021-05-14: 8 mg via INTRAVENOUS

## 2021-05-14 MED ORDER — SODIUM CHLORIDE 0.9 % IV SOLN: INTRAVENOUS | Status: DC

## 2021-05-14 MED ORDER — MIDAZOLAM HCL 2 MG/2ML IJ SOLN
1.0000 mg | INTRAMUSCULAR | Status: DC
  Administered 2021-05-14: 2 mg via INTRAVENOUS
  Filled 2021-05-14: qty 2

## 2021-05-14 MED ORDER — FERROUS SULFATE 325 (65 FE) MG PO TABS
325.0000 mg | ORAL_TABLET | Freq: Three times a day (TID) | ORAL | Status: DC
  Administered 2021-05-14 – 2021-05-16 (×5): 325 mg via ORAL
  Filled 2021-05-14 (×5): qty 1

## 2021-05-14 MED ORDER — SODIUM CHLORIDE (PF) 0.9 % IJ SOLN
INTRAMUSCULAR | Status: AC
  Filled 2021-05-14: qty 30

## 2021-05-14 MED ORDER — BUPIVACAINE-EPINEPHRINE (PF) 0.25% -1:200000 IJ SOLN
INTRAMUSCULAR | Status: AC
  Filled 2021-05-14: qty 30

## 2021-05-14 MED ORDER — CHLORHEXIDINE GLUCONATE 0.12 % MT SOLN
15.0000 mL | Freq: Once | OROMUCOSAL | Status: AC
  Administered 2021-05-14: 15 mL via OROMUCOSAL

## 2021-05-14 MED ORDER — ORAL CARE MOUTH RINSE: 15.0000 mL | Freq: Once | OROMUCOSAL | Status: AC

## 2021-05-14 MED ORDER — BUPIVACAINE-EPINEPHRINE (PF) 0.25% -1:200000 IJ SOLN
INTRAMUSCULAR | Status: DC | PRN
  Administered 2021-05-14: 30 mL

## 2021-05-14 MED ORDER — KETOROLAC TROMETHAMINE 30 MG/ML IJ SOLN
INTRAMUSCULAR | Status: AC
  Filled 2021-05-14: qty 1

## 2021-05-14 MED ORDER — HYDROMORPHONE HCL 1 MG/ML IJ SOLN: 0.5000 mg | INTRAMUSCULAR | Status: DC | PRN

## 2021-05-14 MED ORDER — IMIPRAMINE HCL 50 MG PO TABS
75.0000 mg | ORAL_TABLET | Freq: Every day | ORAL | Status: DC
  Administered 2021-05-14 – 2021-05-15 (×2): 75 mg via ORAL
  Filled 2021-05-14 (×2): qty 1

## 2021-05-14 MED ORDER — ONDANSETRON HCL 4 MG/2ML IJ SOLN
INTRAMUSCULAR | Status: DC | PRN
  Administered 2021-05-14: 4 mg via INTRAVENOUS

## 2021-05-14 SURGICAL SUPPLY — 56 items
ATTUNE MED ANAT PAT 35 KNEE (Knees) ×1 IMPLANT
ATTUNE PSFEM RTSZ5 NARCEM KNEE (Femur) ×1 IMPLANT
ATTUNE PSRP INSR SZ5 8 KNEE (Insert) ×1 IMPLANT
BAG COUNTER SPONGE SURGICOUNT (BAG) IMPLANT
BAG ZIPLOCK 12X15 (MISCELLANEOUS) IMPLANT
BASE TIBIAL ROT PLAT SZ 5 KNEE (Knees) IMPLANT
BLADE SAW SGTL 11.0X1.19X90.0M (BLADE) IMPLANT
BLADE SAW SGTL 13.0X1.19X90.0M (BLADE) ×2 IMPLANT
BNDG ELASTIC 6X5.8 VLCR STR LF (GAUZE/BANDAGES/DRESSINGS) ×2 IMPLANT
BOWL SMART MIX CTS (DISPOSABLE) ×2 IMPLANT
CEMENT HV SMART SET (Cement) ×2 IMPLANT
CUFF TOURN SGL QUICK 34 (TOURNIQUET CUFF) ×1
CUFF TRNQT CYL 34X4.125X (TOURNIQUET CUFF) ×1 IMPLANT
DERMABOND ADVANCED (GAUZE/BANDAGES/DRESSINGS) ×1
DERMABOND ADVANCED .7 DNX12 (GAUZE/BANDAGES/DRESSINGS) ×1 IMPLANT
DRAPE SHEET LG 3/4 BI-LAMINATE (DRAPES) ×2 IMPLANT
DRAPE U-SHAPE 47X51 STRL (DRAPES) ×2 IMPLANT
DRESSING AQUACEL AG SP 3.5X10 (GAUZE/BANDAGES/DRESSINGS) ×1 IMPLANT
DRSG AQUACEL AG SP 3.5X10 (GAUZE/BANDAGES/DRESSINGS) ×2
DURAPREP 26ML APPLICATOR (WOUND CARE) ×4 IMPLANT
ELECT REM PT RETURN 15FT ADLT (MISCELLANEOUS) ×2 IMPLANT
FACESHIELD WRAPAROUND (MASK) ×4 IMPLANT
FACESHIELD WRAPAROUND OR TEAM (MASK) ×2 IMPLANT
GLOVE BIO SURGEON STRL SZ 6 (GLOVE) ×2 IMPLANT
GLOVE BIO SURGEON STRL SZ7.5 (GLOVE) ×2 IMPLANT
GLOVE BIOGEL PI IND STRL 6.5 (GLOVE) ×1 IMPLANT
GLOVE BIOGEL PI IND STRL 7.5 (GLOVE) ×1 IMPLANT
GLOVE BIOGEL PI INDICATOR 6.5 (GLOVE) ×1
GLOVE BIOGEL PI INDICATOR 7.5 (GLOVE) ×1
GOWN STRL REUS W/ TWL LRG LVL3 (GOWN DISPOSABLE) ×2 IMPLANT
GOWN STRL REUS W/TWL LRG LVL3 (GOWN DISPOSABLE) ×2
HANDPIECE INTERPULSE COAX TIP (DISPOSABLE) ×1
HOLDER FOLEY CATH W/STRAP (MISCELLANEOUS) ×1 IMPLANT
KIT TURNOVER KIT A (KITS) ×1 IMPLANT
MANIFOLD NEPTUNE II (INSTRUMENTS) ×2 IMPLANT
NDL SAFETY ECLIPSE 18X1.5 (NEEDLE) IMPLANT
NEEDLE HYPO 18GX1.5 SHARP (NEEDLE)
NS IRRIG 1000ML POUR BTL (IV SOLUTION) ×2 IMPLANT
PACK TOTAL KNEE CUSTOM (KITS) ×2 IMPLANT
PROTECTOR NERVE ULNAR (MISCELLANEOUS) ×2 IMPLANT
SET HNDPC FAN SPRY TIP SCT (DISPOSABLE) ×1 IMPLANT
SET PAD KNEE POSITIONER (MISCELLANEOUS) ×2 IMPLANT
SPIKE FLUID TRANSFER (MISCELLANEOUS) ×4 IMPLANT
SUT MNCRL AB 4-0 PS2 18 (SUTURE) ×2 IMPLANT
SUT STRATAFIX PDS+ 0 24IN (SUTURE) ×2 IMPLANT
SUT VIC AB 1 CT1 36 (SUTURE) ×2 IMPLANT
SUT VIC AB 2-0 CT1 27 (SUTURE) ×2
SUT VIC AB 2-0 CT1 TAPERPNT 27 (SUTURE) ×2 IMPLANT
SYR 3ML LL SCALE MARK (SYRINGE) ×2 IMPLANT
TIBIAL BASE ROT PLAT SZ 5 KNEE (Knees) ×2 IMPLANT
TOWEL GREEN STERILE FF (TOWEL DISPOSABLE) ×2 IMPLANT
TRAY CATH INTERMITTENT SS 16FR (CATHETERS) ×2 IMPLANT
TRAY FOLEY MTR SLVR 16FR STAT (SET/KITS/TRAYS/PACK) ×2 IMPLANT
TUBE SUCTION HIGH CAP CLEAR NV (SUCTIONS) ×2 IMPLANT
WATER STERILE IRR 1000ML POUR (IV SOLUTION) ×4 IMPLANT
WRAP KNEE MAXI GEL POST OP (GAUZE/BANDAGES/DRESSINGS) ×2 IMPLANT

## 2021-05-14 NOTE — Evaluation (Signed)
Physical Therapy Evaluation ?Patient Details ?Name: Shirley Morris ?MRN: [id number] ?DOB: 08-19-1970 ?Today's Date: 05/14/2021 ? ?History of Present Illness ? Pt is a 51yo female s/p R-TKA on 05/14/21. PMH: anemia, HTN, SVT, L heart cath,  ?Clinical Impression ? Shirley Morris is a 51 y.o. female POD 0 s/p R-TKA. Patient reports modified independence with mobility at baseline. Patient is now limited by functional impairments (see PT problem list below) and requires min assist for transfers. During bed mobility, pt reporting high level of nausea and pain as well as decreased sensation to her glutes. Further mobility deferred secondary to nausea, pain level, and anesthesia not won off yet. Patient instructed in exercise to facilitate ROM and circulation to manage edema. Patient will benefit from continued skilled PT interventions to address impairments and progress towards PLOF. Acute PT will follow to progress mobility and stair training in preparation for safe discharge home. ?   ?   ? ?Recommendations for follow up therapy are one component of a multi-disciplinary discharge planning process, led by the attending physician.  Recommendations may be updated based on patient status, additional functional criteria and insurance authorization. ? ?Follow Up Recommendations Follow physician's recommendations for discharge plan and follow up therapies ? ?  ?Assistance Recommended at Discharge Intermittent Supervision/Assistance  ?Patient can return home with the following ? A little help with walking and/or transfers;A little help with bathing/dressing/bathroom;Assistance with cooking/housework;Assist for transportation;Help with stairs or ramp for entrance ? ?  ?Equipment Recommendations BSC/3in1;Rolling walker (2 wheels)  ?Recommendations for Other Services ?    ?  ?Functional Status Assessment Patient has had a recent decline in their functional status and demonstrates the ability to make significant  improvements in function in a reasonable and predictable amount of time.  ? ?  ?Precautions / Restrictions Precautions ?Precautions: Fall  ? ?  ? ?Mobility ? Bed Mobility ?Overal bed mobility: Needs Assistance ?Bed Mobility: Supine to Sit ?  ?  ?Supine to sit: Min assist ?  ?  ?General bed mobility comments: Pt min assist to advance BLE off bed. During bed mobility, pt reporting increased pain and high level of nausea. Attempted to give pt time to recover and during this time further evaluated pt who reported reduced sensation in her gluteal muscles with reduced motor control. Further mobility deferred secondary to nausea and anesthesia not worn off yet. ?  ? ?Transfers ?  ?  ?  ?  ?  ?  ?  ?  ?  ?General transfer comment: deferred ?  ? ?Ambulation/Gait ?  ?  ?  ?  ?  ?  ?  ?General Gait Details: deferred ? ?Stairs ?  ?  ?  ?  ?  ? ?Wheelchair Mobility ?  ? ?Modified Rankin (Stroke Patients Only) ?  ? ?  ? ?Balance Overall balance assessment: Mild deficits observed, not formally tested ?  ?  ?  ?  ?  ?  ?  ?  ?  ?  ?  ?  ?  ?  ?  ?  ?  ?  ?   ? ? ? ?Pertinent Vitals/Pain Pain Assessment ?Pain Assessment: No/denies pain  ? ? ?Home Living Family/patient expects to be discharged to:: Private residence ?Living Arrangements: Parent (Mom [place]) ?Available Help at Discharge: Family ?Type of Home: House ?Home Access: Stairs to enter ?Entrance Stairs-Rails: Left ?Entrance Stairs-Number of Steps: 6 ?  ?Home Layout: One level ?Home Equipment: None ?Additional Comments: Stepladder to get into bed,  ?  ?  Prior Function Prior Level of Function : Independent/Modified Independent ?  ?  ?  ?  ?  ?  ?Mobility Comments: crutches ?ADLs Comments: ind ?  ? ? ?Hand Dominance  ?   ? ?  ?Extremity/Trunk Assessment  ? Upper Extremity Assessment ?Upper Extremity Assessment: Overall WFL for tasks assessed ?  ? ?Lower Extremity Assessment ?Lower Extremity Assessment: RLE deficits/detail;LLE deficits/detail ?RLE Deficits / Details: MMT ank  df/pf 4/5, pt unable to fully raise legs ?RLE Sensation: decreased light touch ?LLE Deficits / Details: mmt ank df/pf 4/5 ?LLE Sensation: WNL ?  ? ?Cervical / Trunk Assessment ?Cervical / Trunk Assessment: Normal  ?Communication  ? Communication: No difficulties  ?Cognition Arousal/Alertness: Awake/alert ?Behavior During Therapy: [facility] for tasks assessed/performed ?Overall Cognitive Status: Within Functional Limits for tasks assessed ?  ?  ?  ?  ?  ?  ?  ?  ?  ?  ?  ?  ?  ?  ?  ?  ?  ?  ?  ? ?  ?General Comments   ? ?  ?Exercises Total Joint Exercises ?Ankle Circles/Pumps: AROM, Both, 5 reps  ? ?Assessment/Plan  ?  ?PT Assessment Patient needs continued PT services  ?PT Problem List Decreased strength;Decreased range of motion;Decreased activity tolerance;Decreased balance;Decreased mobility;Decreased coordination;Pain ? ?   ?  ?PT Treatment Interventions DME instruction;Gait training;Stair training;Functional mobility training;Therapeutic activities;Therapeutic exercise;Balance training;Neuromuscular re-education;Patient/family education   ? ?PT Goals (Current goals can be found in the Care Plan section)  ?Acute Rehab PT Goals ?Patient Stated Goal: walk straight again ?PT Goal Formulation: With patient ?Time For Goal Achievement: 05/21/21 ?Potential to Achieve Goals: Good ? ?  ?Frequency 7X/week ?  ? ? ?Co-evaluation   ?  ?  ?  ?  ? ? ?  ?AM-PAC PT "6 Clicks" Mobility  ?Outcome Measure Help needed turning from your back to your side while in a flat bed without using bedrails?: A Little ?Help needed moving from lying on your back to sitting on the side of a flat bed without using bedrails?: A Little ?Help needed moving to and from a bed to a chair (including a wheelchair)?: A Little ?Help needed standing up from a chair using your arms (e.g., wheelchair or bedside chair)?: A Little ?Help needed to walk in hospital room?: A Little ?Help needed climbing 3-5 steps with a railing? : A Lot ?6 Click Score: 17 ? ?  ?End of  Session Equipment Utilized During Treatment: Gait belt ?Activity Tolerance: Patient limited by pain;Treatment limited secondary to medical complications (Comment) (Nausea, pain, and anesthesia not worn off) ?Patient left: in bed;with call bell/phone within reach;with bed alarm set;with family/visitor present ?Nurse Communication: Mobility status;Other (comment) (pt requesting nausea meds) ?PT Visit Diagnosis: Difficulty in walking, not elsewhere classified (R26.2);Pain ?Pain - Right/Left: Right ?Pain - part of body: Knee ?  ? ?Time: [id number] ?PT Time Calculation (min) (ACUTE ONLY): 11 min ? ? ?Charges:   PT Evaluation ?$PT Eval Low Complexity: 1 Low ?  ?  ?   ? ? ?Coolidge Breeze, PT, DPT ?[facility] ?Office: [phone] ?Pager: [phone] ? ?Coolidge Breeze ?05/14/2021, 7:34 PM ? ?

## 2021-05-14 NOTE — Plan of Care (Signed)
  Problem: Activity: Goal: Risk for activity intolerance will decrease Outcome: Progressing   Problem: Pain Managment: Goal: General experience of comfort will improve Outcome: Progressing   Problem: Safety: Goal: Ability to remain free from injury will improve Outcome: Progressing   

## 2021-05-14 NOTE — Anesthesia Procedure Notes (Signed)
Procedure Name: [place] ?Date/Time: 05/14/2021 10:46 AM ?Performed by: Claudia Desanctis, CRNA ?Pre-anesthesia Checklist: Patient identified, Emergency Drugs available, Suction available and Patient being monitored ?Patient Re-evaluated:Patient Re-evaluated prior to induction ?Oxygen Delivery Method: Simple face mask ? ? ? ? ?

## 2021-05-14 NOTE — Progress Notes (Signed)
Assisted Dr. Miller with right, adductor canal, ultrasound guided block. Side rails up, monitors on throughout procedure. See vital signs in flow sheet. Tolerated Procedure well. ? ?

## 2021-05-14 NOTE — Discharge Instructions (Signed)

## 2021-05-14 NOTE — Anesthesia Procedure Notes (Signed)
Anesthesia Regional Block: Adductor canal block  ? ?Pre-Anesthetic Checklist: , timeout performed,  Correct Patient, Correct Site, Correct Laterality,  Correct Procedure, Correct Position, site marked,  Risks and benefits discussed,  Surgical consent,  Pre-op evaluation,  At surgeon's request and post-op pain management ? ?Laterality: Right ? ?Prep: chloraprep     ?  ?Needles:  ?Injection technique: Single-shot ? ?Needle Type: Stimiplex   ? ? ?Needle Length: 9cm  ?Needle Gauge: 21  ? ? ? ?Additional Needles: ? ? ?Procedures:,,,, ultrasound used (permanent image in chart),,    ?Narrative:  ?Start time: 05/14/2021 9:21 AM ?End time: 05/14/2021 9:26 AM ?Injection made incrementally with aspirations every 5 mL. ? ?Performed by: Personally  ?Anesthesiologist: Lynda Rainwater, MD ? ? ? ? ?

## 2021-05-14 NOTE — Transfer of Care (Signed)
Immediate Anesthesia Transfer of Care Note ? ?Patient: Shirley Morris ? ?Procedure(s) Performed: TOTAL KNEE ARTHROPLASTY (Right: Knee) ? ?Patient Location: PACU ? ?Anesthesia Type:Spinal ? ?Level of Consciousness: awake and patient cooperative ? ?Airway & Oxygen Therapy: Patient Spontanous Breathing and Patient connected to face mask ? ?Post-op Assessment: Report given to RN and Post -op Vital signs reviewed and stable ? ?Post vital signs: Reviewed and stable ? ?Last Vitals:  ?Vitals Value Taken Time  ?BP 118/76 05/14/21 1224  ?Temp    ?Pulse 85 05/14/21 1225  ?Resp 18 05/14/21 1225  ?SpO2 98 % 05/14/21 1225  ?Vitals shown include unvalidated device data. ? ?Last Pain:  ?Vitals:  ? 05/14/21 0935  ?PainSc: 0-No pain  ?   ? ?  ? ?Complications: No notable events documented. ?

## 2021-05-14 NOTE — Anesthesia Procedure Notes (Signed)
Spinal ? ?Patient location during procedure: OR ?Start time: 05/14/2021 10:32 AM ?End time: 05/14/2021 10:37 AM ?Reason for block: surgical anesthesia ?Staffing ?Performed: anesthesiologist  ?Anesthesiologist: Lynda Rainwater, MD ?Preanesthetic Checklist ?Completed: patient identified, IV checked, site marked, risks and benefits discussed, surgical consent, monitors and equipment checked, pre-op evaluation and timeout performed ?Spinal Block ?Patient position: sitting ?Prep: DuraPrep ?Patient monitoring: heart rate, cardiac monitor, continuous pulse ox and blood pressure ?Approach: midline ?Location: L3-4 ?Injection technique: single-shot ?Needle ?Needle type: Sprotte  ?Needle gauge: 24 G ?Needle length: 9 cm ?Assessment ?Sensory level: T4 ?Events: CSF return ? ? ? ?

## 2021-05-14 NOTE — H&P (Signed)
TOTAL KNEE ADMISSION H&P ? ?Patient is being admitted for right total knee arthroplasty. ? ?Subjective: ? ?Chief Complaint:right knee pain. ? ?HPI: Shirley Morris, 51 y.o. female, has a history of pain and functional disability in the right knee due to arthritis and has failed non-surgical conservative treatments for greater than 12 weeks to includeNSAID's and/or analgesics, corticosteriod injections, and activity modification.  Onset of symptoms was gradual, starting 3 years ago with gradually worsening course since that time. The patient noted prior procedures on the knee to include  arthroscopy and menisectomy on the right knee(s).  Patient currently rates pain in the right knee(s) at 8 out of 10 with activity. Patient has worsening of pain with activity and weight bearing, pain that interferes with activities of daily living, and crepitus.  Patient has evidence of joint space narrowing by imaging studies. There is no active infection. ? ?Patient Active Problem List  ? Diagnosis Date Noted  ? Ovarian cyst 12/19/2013  ? Adult BMI 40.0-44.9 kg/sq m (HCC) 12/19/2013  ? Postoperative anemia due to acute blood loss 12/19/2013  ? Right ovarian cyst 12/16/2013  ? Abnormal nuclear stress test 12/11/2013  ? Breast asymmetry in female 09/23/2011  ? Pelvic pain in female 08/31/2011  ? Chest discomfort 11/27/2010  ? SVT (supraventricular tachycardia) (HCC) 11/27/2010  ? HTN (hypertension) 11/27/2010  ? ?Past Medical History:  ?Diagnosis Date  ? Anemia   ? Chest pain   ? Negative stress echo in February of 2011  ? Hypertension   ? Injury of thumb, right   ? Migraine   ? Palpitations   ? SVT (supraventricular tachycardia) (HCC)   ? Tear of meniscus of right knee   ?  ?Past Surgical History:  ?Procedure Laterality Date  ? BREAST BIOPSY Right 07/08/2020  ? Procedure: EXCISION OF RIGHT BREAST MASS;  Surgeon: Emelia Loron, MD;  Location: [facility];  Service: General;  Laterality: Right;  ? CESAREAN  SECTION  1996  ? Gastic bypass surgery by laparocopy  01/25/2005  ? KNEE ARTHROSCOPY Right 01/25/2009  ? x4  ? LAPAROSCOPIC ASSISTED VAGINAL HYSTERECTOMY Right 12/17/2013  ? Procedure: LAPAROSCOPIC ASSISTED VAGINAL HYSTERECTOMY WITH RIGHT SALPINGO OOPHERECTOMY ;  Surgeon: Hal Morales, MD;  Location: [facility];  Service: Gynecology;  Laterality: Right;  ? LEFT HEART CATHETERIZATION WITH CORONARY ANGIOGRAM N/A 12/11/2013  ? Procedure: LEFT HEART CATHETERIZATION WITH CORONARY ANGIOGRAM;  Surgeon: Peter M Swaziland, MD;  Location: [facility] CATH LAB;  Service: Cardiovascular;  Laterality: N/A;  ? US ECHOCARDIOGRAPHY  11/26/2008  ? EF 55-60%  ?  ?No current facility-administered medications for this encounter.  ? ?Current Outpatient Medications  ?Medication Sig Dispense Refill Last Dose  ? Ascorbic Acid (VITAMIN C) 500 MG CAPS Take 500 mg by mouth daily.     ? CALCIUM-VITAMIN D PO Take 1 tablet by mouth daily.     ? Cholecalciferol (VITAMIN D3) 25 MCG (1000 UT) CHEW Chew 1,000 Units by mouth daily.     ? cyclobenzaprine (FLEXERIL) 5 MG tablet Take 5 mg by mouth 3 (three) times daily as needed for muscle spasms.     ? ferrous sulfate 325 (65 FE) MG tablet Take 325 mg by mouth daily with breakfast.     ? fluocinonide cream (LIDEX) 0.05 % Apply 1 application topically daily as needed. Eczema  0   ? imipramine (TOFRANIL) 25 MG tablet Take 75 mg by mouth at bedtime.     ? meloxicam (MOBIC) 15 MG tablet Take 15 mg  by mouth daily.     ? metoprolol succinate (TOPROL XL) 25 MG 24 hr tablet Take 1 tablet (25 mg total) by mouth daily. 90 tablet 3   ? Multiple Vitamin (MULTIVITAMIN) tablet Take 2 tablets by mouth daily. Flintstone's Chewable     ? OVER THE COUNTER MEDICATION Take 1 capsule by mouth daily. AZO     ? OVER THE COUNTER MEDICATION Take 1 capsule by mouth daily. Black Kohosh     ? triamterene-hydrochlorothiazide (MAXZIDE-25) 37.5-25 MG tablet Take 0.5 tablets by mouth daily. 45 tablet 3   ? Vitamin D, Ergocalciferol, (DRISDOL)  1.25 MG (50000 UT) CAPS capsule Take 50,000 Units by mouth every Wednesday.     ? WEGOVY 1.7 MG/0.75ML SOAJ Inject 1.7 mg into the skin every Tuesday.     ? ?Allergies  ?Allergen Reactions  ? Penicillins Hives and Swelling  ?  tongue swelling  ? Sulfa Antibiotics Swelling  ?  Tongue swelling   ?  ?Social History  ? ?Tobacco Use  ? Smoking status: Never  ? Smokeless tobacco: Never  ?Substance Use Topics  ? Alcohol use: No  ?  ?Family History  ?Problem Relation Age of Onset  ? Coronary artery disease Father   ? Diabetes Father   ? Hypertension Father   ? Colon cancer Maternal Uncle   ? Colon cancer Cousin   ?     First cousin maternal side   ?  ? ?Review of Systems  ?Constitutional:  Negative for chills and fever.  ?Respiratory:  Negative for cough and shortness of breath.   ?Cardiovascular:  Negative for chest pain.  ?Gastrointestinal:  Negative for nausea and vomiting.  ?Musculoskeletal:  Positive for arthralgias.  ? ? ?Objective: ? ?Physical Exam ?Well nourished and well developed. ?General: Alert and oriented x3, cooperative and pleasant, no acute distress. ?Head: normocephalic, atraumatic, neck supple. ?Eyes: EOMI. ? ?Musculoskeletal: ?Right knee exam: ?Her portal sites are healed. There is no signs of infection ?No palpable effusion, warmth erythema ?She has a 5 degree flexion contracture and flexes with tightness and discomfort over 100 degrees ?No lower extremity edema or erythema ? ? ?Calves soft and nontender. Motor function intact in LE. Strength 5/5 LE bilaterally. ?Neuro: Distal pulses 2+. Sensation to light touch intact in LE. ? ?Vital signs in last 24 hours: ?  ? ?Labs: ? ? ?Estimated body mass index is 38.94 kg/m? as calculated from the following: ?  Height as of 05/07/21: 5\' 5"  (1.651 m). ?  Weight as of 05/07/21: 106.1 kg. ? ? ?Imaging Review ?Plain radiographs demonstrate severe degenerative joint disease of the right knee(s). The overall alignment isneutral. The bone quality appears to be adequate  for age and reported activity level. ? ? ? ? ? ?Assessment/Plan: ? ?End stage arthritis, right knee  ? ?The patient history, physical examination, clinical judgment of the provider and imaging studies are consistent with end stage degenerative joint disease of the right knee(s) and total knee arthroplasty is deemed medically necessary. The treatment options including medical management, injection therapy arthroscopy and arthroplasty were discussed at length. The risks and benefits of total knee arthroplasty were presented and reviewed. The risks due to aseptic loosening, infection, stiffness, patella tracking problems, thromboembolic complications and other imponderables were discussed. The patient acknowledged the explanation, agreed to proceed with the plan and consent was signed. Patient is being admitted for inpatient treatment for surgery, pain control, PT, OT, prophylactic antibiotics, VTE prophylaxis, progressive ambulation and ADL's and discharge planning. The patient  is planning to be discharged  home. ? ? ?Therapy Plans: outpatient therapy at [facility] ?Disposition: Home with parents ?Planned DVT Prophylaxis: aspirin 81mg  BID ?DME needed: walker ?PCP: Dr. Flora Lipps'Neal, clearance received ?Cardiologist: Dr. Elease HashimotoNahser, clearance received ?TXA: IV ?Allergies: sulfa drugs - hives ?Anesthesia Concerns: none ?BMI: 38.5 ?Last HgbA1c: Not diabetic ? ?Other: ?- Dilaudid (N/V), robaxin, tylenol, ?zofran, toradol ?- No NSAIDs related to gastric bypass ? ? ?Patient's anticipated LOS is less than 2 midnights, meeting these requirements: ?- Younger than 65 ?- Lives within 1 hour of care ?- Has a competent adult at home to recover with post-op recover ?- NO history of ? - Chronic pain requiring opiods ? - Diabetes ? - Coronary Artery Disease ? - Heart failure ? - Heart attack ? - Stroke ? - DVT/VTE ? - Cardiac arrhythmia ? - Respiratory Failure/COPD ? - Renal failure ? - Anemia ? - Advanced Liver disease ? ?Rosalene BillingsAshley Melvyn Hommes,  PA-C ?Orthopedic Surgery ?[facility] [place] ?[phone] ? ? ? ?

## 2021-05-14 NOTE — Anesthesia Postprocedure Evaluation (Signed)
Anesthesia Post Note ? ?Patient: Shirley Morris ? ?Procedure(s) Performed: TOTAL KNEE ARTHROPLASTY (Right: Knee) ? ?  ? ?Patient location during evaluation: PACU ?Anesthesia Type: Spinal ?Level of consciousness: awake and alert ?Pain management: pain level controlled ?Vital Signs Assessment: post-procedure vital signs reviewed and stable ?Respiratory status: spontaneous breathing, nonlabored ventilation and respiratory function stable ?Cardiovascular status: blood pressure returned to baseline and stable ?Postop Assessment: no apparent nausea or vomiting ?Anesthetic complications: no ? ? ?No notable events documented. ? ?Last Vitals:  ?Vitals:  ? 05/14/21 1315 05/14/21 1328  ?BP: 119/74 131/86  ?Pulse: 87 70  ?Resp: 17   ?Temp: 36.6 ?C 36.6 ?C  ?SpO2: 98% 99%  ?  ?Last Pain:  ?Vitals:  ? 05/14/21 1328  ?TempSrc: Oral  ?PainSc:   ? ? ?  ?  ?  ?  ?  ?  ? ?Lowella Curb ? ? ? ? ?

## 2021-05-14 NOTE — Interval H&P Note (Signed)
History and Physical Interval Note: ? ?05/14/2021 ?8:40 AM ? ?Shirley Morris  has presented today for surgery, with the diagnosis of Right knee osteoarthritis.  The various methods of treatment have been discussed with the patient and family. After consideration of risks, benefits and other options for treatment, the patient has consented to  Procedure(s): ?TOTAL KNEE ARTHROPLASTY (Right) as a surgical intervention.  The patient's history has been reviewed, patient examined, no change in status, stable for surgery.  I have reviewed the patient's chart and labs.  Questions were answered to the patient's satisfaction.   ? ? ?[name] ? ? ?

## 2021-05-14 NOTE — Op Note (Signed)
NAME:  Shirley Morris                  ?   ? MEDICAL RECORD NO.:  [id number]  ?   ?                        FACILITY:  [facility]  ?   ? PHYSICIAN:  Pietro Cassis. Alvan Dame, M.D.  DATE OF BIRTH:  1970-07-30  ?   ? DATE OF PROCEDURE:  05/14/2021   ?  ?   ?                             OPERATIVE REPORT  ?   ?   ? PREOPERATIVE DIAGNOSIS:  Right knee osteoarthritis.  ?   ? POSTOPERATIVE DIAGNOSIS:  Right knee osteoarthritis.  ?   ? FINDINGS:  The patient was noted to have complete loss of cartilage and  ? bone-on-bone arthritis with associated osteophytes in the medial and patellofemoral compartments of  ? the knee with a significant synovitis and associated effusion.  The patient had failed months of conservative treatment including medications, injection therapy, activity modification. ?   ? PROCEDURE:  Right total knee replacement.  ?   ? COMPONENTS USED:  DePuy Attune rotating platform posterior stabilized knee  ? system, a size 5N femur, 5 tibia, size 8 mm PS AOX insert, and 35 anatomic patellar  ? button.  ?   ? SURGEON:  Pietro Cassis. Alvan Dame, M.D.  ?   ? ASSISTANT:  Costella Hatcher, PA-C.  ?   ? ANESTHESIA:  Regional and Spinal.  ?   ? SPECIMENS:  None.  ?   ? COMPLICATION:  None.  ?   ? DRAINS:  None. ? ?EBL: <200 cc  ?   ? TOURNIQUET TIME:  32 min at 225 mmHg ?   ? The patient was stable to the recovery room.  ?   ? INDICATION FOR PROCEDURE:  Shirley Morris is a 51 y.o. female patient of  ? mine.  The patient had been seen, evaluated, and treated for months conservatively in the  ? office with medication, activity modification, and injections.  The patient had  ? radiographic changes of bone-on-bone arthritis with endplate sclerosis and osteophytes noted.  Based on the radiographic changes and failed conservative measures, the patient  ? decided to proceed with definitive treatment, total knee replacement.  Risks of infection, DVT, component failure, need for revision surgery, neurovascular injury were reviewed in the  office setting.  The postop course was reviewed stressing the efforts to maximize post-operative satisfaction and function.  Consent was obtained for benefit of pain  ? relief.  ?   ? PROCEDURE IN DETAIL:  The patient was brought to the operative theater.  ? Once adequate anesthesia, preoperative antibiotics, 2 gm of Ancef, 1 gm of Vancomycin,1 gm of Tranexamic Acid, and 10 mg of Decadron administered, the patient was positioned supine with a right thigh tourniquet placed.  The  ?right lower extremity was prepped and draped in sterile fashion.  A time-  ? out was performed identifying the patient, planned procedure, and the appropriate extremity.  ?   ? The right lower extremity was placed in the [facility] leg holder.  The leg was  ? exsanguinated, tourniquet elevated to 250 mmHg.  A midline incision was  ? made followed by median parapatellar arthrotomy.  Following initial  ?  exposure, attention was first directed to the patella.  Precut  ? measurement was noted to be 23 mm.  I resected down to 14 mm and used a  ? 35 anatomic patellar button to restore patellar height as well as cover the cut surface.  ?   ? The lug holes were drilled and a metal shim was placed to protect the  ? patella from retractors and saw blade during the procedure.  ?   ? At this point, attention was now directed to the femur.  The femoral  ? canal was opened with a drill, irrigated to try to prevent fat emboli.  An  ? intramedullary rod was passed at 3 degrees valgus, 9 mm of bone was  ? resected off the distal femur.  Following this resection, the tibia was  ? subluxated anteriorly.  Using the extramedullary guide, 2 mm of bone was resected off  ? the proximal medial tibia.  We confirmed the gap would be  ? stable medially and laterally with a size 5 spacer block as well as confirmed that the tibial cut was perpendicular in the coronal plane, checking with an alignment rod.  ?   ? Once this was done, I sized the femur to be a size 5 in the  anterior-  ? posterior dimension, chose a narrow component based on medial and  ? lateral dimension.  The size 5 rotation block was then pinned in  ? position anterior referenced using the C-clamp to set rotation.  The  ? anterior, posterior, and  chamfer cuts were made without difficulty nor  ? notching making certain that I was along the anterior cortex to help  ? with flexion gap stability.  ?   ? The final box cut was made off the lateral aspect of distal femur.  ?   ? At this point, the tibia was sized to be a size 5.  The size 5 tray was  ? then pinned in position through the medial third of the tubercle,  ? drilled, and keel punched.  Trial reduction was now carried with a 5 femur, ? 5 tibia, a size size 8 mm PS insert, and the 35 anatomic patella botton.  The knee was brought to full extension with good flexion stability with the patella  ? tracking through the trochlea without application of pressure.  Given  ? all these findings the trial components removed.  Final components were  ? opened and cement was mixed.  The knee was irrigated with normal saline solution and pulse lavage.  The synovial lining was  ? then injected with 30 cc of 0.25% Marcaine with epinephrine, 1 cc of Toradol and 30 cc of NS for a total of 61 cc.  ?   ?Final implants were then cemented onto cleaned and dried cut surfaces of bone with the knee brought to extension with a size 8 mm PS trial insert.  ?   ? Once the cement had fully cured, excess cement was removed  ? throughout the knee.  I confirmed that I was satisfied with the range of  ? motion and stability, and the final size 8 mm PS AOX insert was chosen.  It was  ? placed into the knee.  ?   ? The tourniquet had been let down at 32 minutes.  No significant  ? hemostasis was required.  The extensor mechanism was then reapproximated using #1 Vicryl and #1 Stratafix sutures with the knee  ? in  flexion.  The  ? remaining wound was closed with 2-0 Vicryl and running 4-0 Monocryl.   ? The knee was cleaned, dried, dressed sterilely using Dermabond and  ? Aquacel dressing.  The patient was then  ? brought to recovery room in stable condition, tolerating the procedure  ? well.  ? ?Please note that Physician Assistant, Costella Hatcher, PA-C was present for the entirety of the case, and was utilized for pre-operative positioning, peri-operative retractor management, general facilitation of the procedure and for primary wound closure at the end of the case. ?   ?   ?   ?   ? Pietro Cassis Alvan Dame, M.D.  ? ? ?05/14/2021 10:30 AM  ?

## 2021-05-15 ENCOUNTER — Encounter (HOSPITAL_COMMUNITY): Payer: Self-pay | Admitting: Orthopedic Surgery

## 2021-05-15 DIAGNOSIS — M1711 Unilateral primary osteoarthritis, right knee: Secondary | ICD-10-CM | POA: Diagnosis not present

## 2021-05-15 LAB — CBC
HCT: 34.2 % — ABNORMAL LOW (ref 36.0–46.0)
Hemoglobin: 11.2 g/dL — ABNORMAL LOW (ref 12.0–15.0)
MCH: 30.6 pg (ref 26.0–34.0)
MCHC: 32.7 g/dL (ref 30.0–36.0)
MCV: 93.4 fL (ref 80.0–100.0)
Platelets: 270 10*3/uL (ref 150–400)
RBC: 3.66 MIL/uL — ABNORMAL LOW (ref 3.87–5.11)
RDW: 13.8 % (ref 11.5–15.5)
WBC: 9.1 10*3/uL (ref 4.0–10.5)
nRBC: 0 % (ref 0.0–0.2)

## 2021-05-15 LAB — BASIC METABOLIC PANEL
Anion gap: 7 (ref 5–15)
BUN: 9 mg/dL (ref 6–20)
CO2: 27 mmol/L (ref 22–32)
Calcium: 8.7 mg/dL — ABNORMAL LOW (ref 8.9–10.3)
Chloride: 106 mmol/L (ref 98–111)
Creatinine, Ser: 0.62 mg/dL (ref 0.44–1.00)
GFR, Estimated: >60 mL/min (ref >60)
Glucose, Bld: 101 mg/dL — ABNORMAL HIGH (ref 70–99)
Potassium: 3.4 mmol/L — ABNORMAL LOW (ref 3.5–5.1)
Sodium: 140 mmol/L (ref 135–145)

## 2021-05-15 MED ORDER — ACETAMINOPHEN 325 MG PO TABS: 1000.0000 mg | ORAL_TABLET | Freq: Four times a day (QID) | ORAL | Status: AC

## 2021-05-15 MED ORDER — HYDROMORPHONE HCL 2 MG PO TABS: 2.0000 mg | ORAL_TABLET | ORAL | 0 refills | Status: DC | PRN

## 2021-05-15 MED ORDER — POLYETHYLENE GLYCOL 3350 17 G PO PACK: 17.0000 g | PACK | Freq: Every day | ORAL | 0 refills | Status: DC | PRN

## 2021-05-15 MED ORDER — DOCUSATE SODIUM 100 MG PO CAPS: 100.0000 mg | ORAL_CAPSULE | Freq: Two times a day (BID) | ORAL | 0 refills | Status: DC

## 2021-05-15 MED ORDER — CYCLOBENZAPRINE HCL 10 MG PO TABS: 10.0000 mg | ORAL_TABLET | Freq: Three times a day (TID) | ORAL | 0 refills | Status: AC | PRN

## 2021-05-15 MED ORDER — ASPIRIN 81 MG PO CHEW
81.0000 mg | CHEWABLE_TABLET | Freq: Two times a day (BID) | ORAL | 0 refills | Status: AC
Start: 2021-05-15 — End: 2021-06-12

## 2021-05-15 NOTE — Progress Notes (Signed)
Physical Therapy Treatment ?Patient Details ?Name: Shirley Morris ?MRN: [id number] ?DOB: 1970/04/08 ?Today's Date: 05/15/2021 ? ? ?History of Present Illness Pt is a 51yo female s/p R-TKA on 05/14/21. PMH: anemia, HTN, SVT, L heart cath, ? ?  ?PT Comments  ? ? Pt tolerated increased ambulation distance of 33' with RW, distance limited by pain. She would benefit from one more night in the hospital. Will plan to do stair training tomorrow morning, then I expect she'll be ready to DC home.  ?   ?Recommendations for follow up therapy are one component of a multi-disciplinary discharge planning process, led by the attending physician.  Recommendations may be updated based on patient status, additional functional criteria and insurance authorization. ? ?Follow Up Recommendations ? Follow physician's recommendations for discharge plan and follow up therapies ?  ?  ?Assistance Recommended at Discharge Intermittent Supervision/Assistance  ?Patient can return home with the following A little help with walking and/or transfers;A little help with bathing/dressing/bathroom;Assistance with cooking/housework;Assist for transportation;Help with stairs or ramp for entrance ?  ?Equipment Recommendations ? BSC/3in1;Rolling walker (2 wheels)  ?  ?Recommendations for Other Services   ? ? ?  ?Precautions / Restrictions Precautions ?Precautions: Fall;Knee ?Precaution Booklet Issued: Yes (comment) ?Precaution Comments: reviewed no pillow under knee ?Restrictions ?Weight Bearing Restrictions: No  ?  ? ?Mobility ? Bed Mobility ?  ?  ?  ?  ?  ?  ?  ?General bed mobility comments: up in recliner ?  ? ?Transfers ?Overall transfer level: Needs assistance ?Equipment used: Rolling walker (2 wheels) ?Transfers: Sit to/from Stand ?Sit to Stand: Min guard ?  ?  ?  ?  ?  ?General transfer comment: VCs for hand placement ?  ? ?Ambulation/Gait ?Ambulation/Gait assistance: Min guard ?Gait Distance (Feet): 80 Feet ?Assistive device: Rolling walker (2  wheels) ?Gait Pattern/deviations: Step-to pattern ?Gait velocity: decr ?  ?  ?General Gait Details: VCs sequencing, no loss of balance, distance limited by pain/fatigue ? ? ?Stairs ?  ?  ?  ?  ?  ? ? ?Wheelchair Mobility ?  ? ?Modified Rankin (Stroke Patients Only) ?  ? ? ?  ?Balance Overall balance assessment: Mild deficits observed, not formally tested ?  ?  ?  ?  ?  ?  ?  ?  ?  ?  ?  ?  ?  ?  ?  ?  ?  ?  ?  ? ?  ?Cognition Arousal/Alertness: Awake/alert ?Behavior During Therapy: [facility] for tasks assessed/performed ?Overall Cognitive Status: Within Functional Limits for tasks assessed ?  ?  ?  ?  ?  ?  ?  ?  ?  ?  ?  ?  ?  ?  ?  ?  ?  ?  ?  ? ?  ?Exercises Total Joint Exercises ?Ankle Circles/Pumps: AROM, Both, 10 reps, Supine ?Quad Sets: AROM, Right, 5 reps, Supine ? ?Heel Slides: AAROM, Right, Supine, 10 reps ? ?  ?General Comments   ?  ?  ? ?Pertinent Vitals/Pain Pain Assessment ?Pain Assessment: 0-10 ?Pain Score: 6  ?Pain Location: R knee ?Pain Descriptors / Indicators: Aching, Sore ?Pain Intervention(s): Limited activity within patient's tolerance, Monitored during session, Premedicated before session, Ice applied, Repositioned  ? ? ?Home Living   ?  ?  ?  ?  ?  ?  ?  ?  ?  ?   ?  ?Prior Function    ?  ?  ?   ? ?PT Goals (current  goals can now be found in the care plan section) Acute Rehab PT Goals ?Patient Stated Goal: finish doctorate in education ?PT Goal Formulation: With patient/family ?Time For Goal Achievement: 05/21/21 ?Potential to Achieve Goals: Good ?Progress towards PT goals: Progressing toward goals ? ?  ?Frequency ? ? ? 7X/week ? ? ? ?  ?PT Plan Current plan remains appropriate  ? ? ?Co-evaluation   ?  ?  ?  ?  ? ?  ?AM-PAC PT "6 Clicks" Mobility   ?Outcome Measure ? Help needed turning from your back to your side while in a flat bed without using bedrails?: A Little ?Help needed moving from lying on your back to sitting on the side of a flat bed without using bedrails?: A Little ?Help needed  moving to and from a bed to a chair (including a wheelchair)?: A Little ?Help needed standing up from a chair using your arms (e.g., wheelchair or bedside chair)?: A Little ?Help needed to walk in hospital room?: A Little ?Help needed climbing 3-5 steps with a railing? : A Lot ?6 Click Score: 17 ? ?  ?End of Session Equipment Utilized During Treatment: Gait belt ?Activity Tolerance: Patient limited by pain ?Patient left: with call bell/phone within reach;with family/visitor present;in bed;with bed alarm set ?Nurse Communication: Mobility status ?PT Visit Diagnosis: Difficulty in walking, not elsewhere classified (R26.2);Pain ?Pain - Right/Left: Right ?Pain - part of body: Knee ?  ? ? ?Time: 3846-6599 ?PT Time Calculation (min) (ACUTE ONLY): 31 min ? ?Charges:  $Gait Training: 8-22 mins ?$Therapeutic Exercise: 8-22 mins          ?          ?Ralene Bathe Kistler PT 05/15/2021  ?Acute Rehabilitation Services ?Pager [phone] ?Office [phone] ? ? ?

## 2021-05-15 NOTE — Plan of Care (Signed)
?  Problem: Education: ?Goal: Knowledge of General Education information will improve ?Description: Including pain rating scale, medication(s)/side effects and non-pharmacologic comfort measures ?Outcome: Progressing ?  ?Problem: Health Behavior/Discharge Planning: ?Goal: Ability to manage health-related needs will improve ?Outcome: Progressing ?  ?Problem: Clinical Measurements: ?Goal: Ability to maintain clinical measurements within normal limits will improve ?Outcome: Progressing ?Goal: Respiratory complications will improve ?Outcome: Not Applicable ?Goal: Cardiovascular complication will be avoided ?Outcome: Not Applicable ?  ?Problem: Clinical Measurements: ?Goal: Respiratory complications will improve ?Outcome: Not Applicable ?  ?Problem: Clinical Measurements: ?Goal: Cardiovascular complication will be avoided ?Outcome: Not Applicable ?  ?Problem: Activity: ?Goal: Risk for activity intolerance will decrease ?Outcome: Progressing ?  ?

## 2021-05-15 NOTE — Progress Notes (Signed)
Physical Therapy Treatment ?Patient Details ?Name: Shirley Morris ?MRN: [id number] ?DOB: 31-Aug-1970 ?Today's Date: 05/15/2021 ? ? ?History of Present Illness Pt is a 51yo female s/p R-TKA on 05/14/21. PMH: anemia, HTN, SVT, L heart cath, ? ?  ?PT Comments  ? ? Pt ambulated 32' with RW, no loss of balance, distance limited by pain. Initiated TKA HEP. Pt reports intermittent numbness & tingling in R toes and lower leg below the knee with mobility. Decreased sensation to light touch R toes at end of session.    ?Recommendations for follow up therapy are one component of a multi-disciplinary discharge planning process, led by the attending physician.  Recommendations may be updated based on patient status, additional functional criteria and insurance authorization. ? ?Follow Up Recommendations ? Follow physician's recommendations for discharge plan and follow up therapies ?  ?  ?Assistance Recommended at Discharge Intermittent Supervision/Assistance  ?Patient can return home with the following A little help with walking and/or transfers;A little help with bathing/dressing/bathroom;Assistance with cooking/housework;Assist for transportation;Help with stairs or ramp for entrance ?  ?Equipment Recommendations ? BSC/3in1;Rolling walker (2 wheels)  ?  ?Recommendations for Other Services   ? ? ?  ?Precautions / Restrictions Precautions ?Precautions: Fall;Knee ?Precaution Booklet Issued: Yes (comment) ?Precaution Comments: reviewed no pillow under knee ?Restrictions ?Weight Bearing Restrictions: No  ?  ? ?Mobility ? Bed Mobility ?  ?  ?  ?  ?  ?  ?  ?General bed mobility comments: up in recliner ?  ? ?Transfers ?Overall transfer level: Needs assistance ?Equipment used: Rolling walker (2 wheels) ?Transfers: Sit to/from Stand ?Sit to Stand: Min guard ?  ?  ?  ?  ?  ?General transfer comment: VCs for hand placement ?  ? ?Ambulation/Gait ?Ambulation/Gait assistance: Min guard ?Gait Distance (Feet): 45 Feet ?Assistive device:  Rolling walker (2 wheels) ?Gait Pattern/deviations: Step-to pattern ?Gait velocity: decr ?  ?  ?General Gait Details: VCs sequencing, no loss of balance, distance limited by pain ? ? ?Stairs ?  ?  ?  ?  ?  ? ? ?Wheelchair Mobility ?  ? ?Modified Rankin (Stroke Patients Only) ?  ? ? ?  ?Balance Overall balance assessment: Mild deficits observed, not formally tested ?  ?  ?  ?  ?  ?  ?  ?  ?  ?  ?  ?  ?  ?  ?  ?  ?  ?  ?  ? ?  ?Cognition Arousal/Alertness: Awake/alert ?Behavior During Therapy: [facility] for tasks assessed/performed ?Overall Cognitive Status: Within Functional Limits for tasks assessed ?  ?  ?  ?  ?  ?  ?  ?  ?  ?  ?  ?  ?  ?  ?  ?  ?  ?  ?  ? ?  ?Exercises Total Joint Exercises ?Ankle Circles/Pumps: AROM, Both, 10 reps, Supine ?Quad Sets: AROM, Right, 5 reps, Supine ?Short Arc Quad: AAROM, Right, 5 reps, Supine ?Heel Slides: AAROM, Right, Supine, 5 reps ?Long Arc Quad: Right, Limitations, PROM ?Long [employer] Limitations: trace to no quad contraction noted with 3 reps ?Knee Flexion: AAROM, Right, 5 reps, Seated ? ?  ?General Comments   ?  ?  ? ?Pertinent Vitals/Pain Pain Assessment ?Pain Assessment: 0-10 ?Pain Score: 8  ?Pain Location: R knee ?Pain Descriptors / Indicators: Aching, Sore ?Pain Intervention(s): Limited activity within patient's tolerance, Monitored during session, Premedicated before session, Ice applied  ? ? ?Home Living   ?  ?  ?  ?  ?  ?  ?  ?  ?  ?   ?  ?  Prior Function    ?  ?  ?   ? ?PT Goals (current goals can now be found in the care plan section) Acute Rehab PT Goals ?Patient Stated Goal: walk straight again ?PT Goal Formulation: With patient/family ?Time For Goal Achievement: 05/21/21 ?Potential to Achieve Goals: Good ?Progress towards PT goals: Progressing toward goals ? ?  ?Frequency ? ? ? 7X/week ? ? ? ?  ?PT Plan Current plan remains appropriate  ? ? ?Co-evaluation   ?  ?  ?  ?  ? ?  ?AM-PAC PT "6 Clicks" Mobility   ?Outcome Measure ? Help needed turning from your back to your  side while in a flat bed without using bedrails?: A Little ?Help needed moving from lying on your back to sitting on the side of a flat bed without using bedrails?: A Little ?Help needed moving to and from a bed to a chair (including a wheelchair)?: A Little ?Help needed standing up from a chair using your arms (e.g., wheelchair or bedside chair)?: A Little ?Help needed to walk in hospital room?: A Little ?Help needed climbing 3-5 steps with a railing? : A Lot ?6 Click Score: 17 ? ?  ?End of Session Equipment Utilized During Treatment: Gait belt ?Activity Tolerance: Patient limited by pain ?Patient left: in chair;with chair alarm set;with call bell/phone within reach;with family/visitor present ?Nurse Communication: Mobility status ?PT Visit Diagnosis: Difficulty in walking, not elsewhere classified (R26.2);Pain ?Pain - Right/Left: Right ?Pain - part of body: Knee ?  ? ? ?Time: [id number] ?PT Time Calculation (min) (ACUTE ONLY): 33 min ? ?Charges:  $Gait Training: 8-22 mins ?$Therapeutic Exercise: 8-22 mins          ?          ? ?Philomena Doheny PT 05/15/2021  ?Acute Rehabilitation Services ?Pager [phone] ?Office [phone] ? ? ?

## 2021-05-15 NOTE — Progress Notes (Signed)
? ?  Subjective: ?1 Day Post-Op Procedure(s) (LRB): ?TOTAL KNEE ARTHROPLASTY (Right) ?Patient reports pain as mild.   ?Patient seen in rounds for Dr. Charlann Boxer. ?Patient is well, and has had no acute complaints or problems. No acute events overnight. Foley catheter removed. Patient ambulated minimally with PT due to residual spinal effects. She did feel nauseous yesterday and requests nausea medicine at home. Friend at the bedside this morning.  ?We will start therapy today.  ? ?Objective: ?Vital signs in last 24 hours: ?Temp:  [97.7 ?F (36.5 ?C)-97.9 ?F (36.6 ?C)] 97.7 ?F (36.5 ?C) (04/21 0537) ?Pulse Rate:  [63-87] 87 (04/21 0537) ?Resp:  [11-17] 14 (04/21 0537) ?BP: (118-138)/(67-92) 125/67 (04/21 0537) ?SpO2:  [95 %-100 %] 100 % (04/21 0537) ? ?Intake/Output from previous day: ? ?Intake/Output Summary (Last 24 hours) at 05/15/2021 1004 ?Last data filed at 05/15/2021 [phone] ?Gross per 24 hour  ?Intake 3088.65 ml  ?Output 2125 ml  ?Net 963.65 ml  ?  ? ?Intake/Output this shift: ?No intake/output data recorded. ? ?Labs: ?Recent Labs  ?  05/15/21 ?0342  ?HGB 11.2*  ? ?Recent Labs  ?  05/15/21 ?0342  ?WBC 9.1  ?RBC 3.66*  ?HCT 34.2*  ?PLT 270  ? ?Recent Labs  ?  05/15/21 ?0342  ?NA 140  ?K 3.4*  ?CL 106  ?CO2 27  ?BUN 9  ?CREATININE 0.62  ?GLUCOSE 101*  ?CALCIUM 8.7*  ? ?No results for input(s): LABPT, INR in the last 72 hours. ? ?Exam: ?General - Patient is Alert and Oriented ?Extremity - Neurologically intact ?Sensation intact distally ?Intact pulses distally ?Dorsiflexion/Plantar flexion intact ?Dressing - dressing C/D/I ?Motor Function - intact, moving foot and toes well on exam.  ? ?Past Medical History:  ?Diagnosis Date  ? Anemia   ? Chest pain   ? Negative stress echo in February of 2011  ? Hypertension   ? Injury of thumb, right   ? Migraine   ? Palpitations   ? SVT (supraventricular tachycardia) (HCC)   ? Tear of meniscus of right knee   ? ? ?Assessment/Plan: ?1 Day Post-Op Procedure(s) (LRB): ?TOTAL KNEE ARTHROPLASTY  (Right) ?Principal Problem: ?  S/P total knee arthroplasty, right ? ?Estimated body mass index is 38.94 kg/m? as calculated from the following: ?  Height as of this encounter: 5\' 5"  (1.651 m). ?  Weight as of this encounter: 106.1 kg. ?Advance diet ?Up with therapy ?D/C IV fluids ? ? ?Patient's anticipated LOS is less than 2 midnights, meeting these requirements: ?- Younger than 65 ?- Lives within 1 hour of care ?- Has a competent adult at home to recover with post-op recover ?- NO history of ? - Chronic pain requiring opiods ? - Diabetes ? - Coronary Artery Disease ? - Heart failure ? - Heart attack ? - Stroke ? - DVT/VTE ? - Cardiac arrhythmia ? - Respiratory Failure/COPD ? - Renal failure ? - Anemia ? - Advanced Liver disease ? ?  ? ?DVT Prophylaxis - Aspirin ?Weight bearing as tolerated. ? ?Hgb stable at 11.2 this AM. ? ?Plan is to go Home after hospital stay. Plan for discharge today following 1-2 sessions of PT as long as they are meeting their goals. Patient is scheduled for OPPT. Follow up in the office in 2 weeks.  ? ? , PA-C ?Orthopedic Surgery ?([phone]) Dennie Bible ?05/15/2021, 10:04 AM  ?

## 2021-05-15 NOTE — Plan of Care (Signed)
  Problem: Safety: Goal: Ability to remain free from injury will improve Outcome: Progressing   Problem: Pain Managment: Goal: General experience of comfort will improve Outcome: Progressing   

## 2021-05-15 NOTE — TOC Transition Note (Signed)
Transition of Care (TOC) - CM/SW Discharge Note ? ?Patient Details  ?Name: KAJOL CRISPEN ?MRN: [id number] ?Date of Birth: 1971-01-11 ? ?Transition of Care (TOC) CM/SW Contact:  ?Sherie Don, LCSW ?Phone Number: ?05/15/2021, 10:04 AM ? ?Clinical Narrative: Patient is expected to discharge home after working with PT. CSW met with patient to confirm discharge plan and needs. Patient will go home with OPPT at [facility]. Patient needs a rolling walker, which was delivered by [employer] to patient's room. Patient reported she will private pay for a 3N1 and ice machine after discharge. TOC signing off. ? ?Final next level of care: OP Rehab ?Barriers to Discharge: No Barriers Identified ? ?Patient Goals and CMS Choice ?Patient states their goals for this hospitalization and ongoing recovery are:: Discharge home with OPPT at [facility] ?CMS Medicare.gov Compare Post Acute Care list provided to:: Patient ?Choice offered to / list presented to : Patient ? ?Discharge Plan and Services        ?DME Arranged: Walker rolling ?DME Agency: [employer] ?Representative spoke with at DME Agency: Prearranged in orthopedist's office ? ?Readmission Risk Interventions ?   ? View : No data to display.  ?  ?  ?  ? ?

## 2021-05-16 DIAGNOSIS — M1711 Unilateral primary osteoarthritis, right knee: Secondary | ICD-10-CM | POA: Diagnosis not present

## 2021-05-16 MED ORDER — SODIUM CHLORIDE 0.9 % IV BOLUS
500.0000 mL | Freq: Once | INTRAVENOUS | Status: AC
  Administered 2021-05-16: 500 mL via INTRAVENOUS

## 2021-05-16 NOTE — Plan of Care (Signed)
  Problem: Education: Goal: Knowledge of General Education information will improve Description: Including pain rating scale, medication(s)/side effects and non-pharmacologic comfort measures Outcome: Progressing   Problem: Activity: Goal: Risk for activity intolerance will decrease Outcome: Progressing   Problem: Pain Managment: Goal: General experience of comfort will improve Outcome: Progressing   

## 2021-05-16 NOTE — Plan of Care (Signed)
?  Problem: Activity: ?Goal: Risk for activity intolerance will decrease ?05/16/2021 0736 by Beverly Sessions, RN ?Outcome: Progressing ?05/16/2021 0736 by Beverly Sessions, RN ?Outcome: Progressing ?  ?Problem: Elimination: ?Goal: Will not experience complications related to bowel motility ?05/16/2021 0736 by Beverly Sessions, RN ?Outcome: Progressing ?05/16/2021 0736 by Beverly Sessions, RN ?Outcome: Progressing ?  ?Problem: Safety: ?Goal: Ability to remain free from injury will improve ?05/16/2021 0736 by Beverly Sessions, RN ?Outcome: Progressing ?05/16/2021 0736 by Beverly Sessions, RN ?Outcome: Progressing ?  ?

## 2021-05-16 NOTE — Progress Notes (Signed)
Physical Therapy Treatment ?Patient Details ?Name: Shirley Morris ?MRN: [id number] ?DOB: 25-Oct-1970 ?Today's Date: 05/16/2021 ? ? ?History of Present Illness Pt is a 51yo female s/p R-TKA on 05/14/21. PMH: anemia, HTN, SVT, L heart cath, ? ?  ?PT Comments  ? ? Pt is progressing well with mobility, she tolerated increased ambulation distance of 110' with RW, no loss of balance. Stair training completed. Pt became dizzy after doing stairs, so assisted pt to bed. BP sitting was 95/60, supine 98/68. RN notified of lower BP, she is giving a bolus. Will return for second session after bolus to practice getting into a high bed. I expect pt will be ready to DC home after that. ?  ?Recommendations for follow up therapy are one component of a multi-disciplinary discharge planning process, led by the attending physician.  Recommendations may be updated based on patient status, additional functional criteria and insurance authorization. ? ?Follow Up Recommendations ? Follow physician's recommendations for discharge plan and follow up therapies ?  ?  ?Assistance Recommended at Discharge Intermittent Supervision/Assistance  ?Patient can return home with the following A little help with walking and/or transfers;A little help with bathing/dressing/bathroom;Assistance with cooking/housework;Assist for transportation;Help with stairs or ramp for entrance ?  ?Equipment Recommendations ? BSC/3in1;Rolling walker (2 wheels)  ?  ?Recommendations for Other Services   ? ? ?  ?Precautions / Restrictions Precautions ?Precautions: Fall;Knee ?Precaution Booklet Issued: Yes (comment) ?Precaution Comments: reviewed no pillow under knee ?Restrictions ?Weight Bearing Restrictions: No  ?  ? ?Mobility ? Bed Mobility ?  ?Bed Mobility: Supine to Sit, Sit to Supine ?  ?  ?Supine to sit: Modified independent (Device/Increase time), HOB elevated ?Sit to supine: Modified independent (Device/Increase time), HOB elevated ?  ?General bed mobility  comments: used gait belt as leg lifter ?  ? ?Transfers ?Overall transfer level: Needs assistance ?Equipment used: Rolling walker (2 wheels) ?Transfers: Sit to/from Stand ?Sit to Stand: Min guard ?  ?  ?  ?  ?  ?General transfer comment: VCs for hand placement ?  ? ?Ambulation/Gait ?Ambulation/Gait assistance: Supervision ?Gait Distance (Feet): 110 Feet ?Assistive device: Rolling walker (2 wheels) ?Gait Pattern/deviations: Step-to pattern ?Gait velocity: decr ?  ?  ?General Gait Details: steady, no loss of balance, distance limited by dizziness. BP sitting 95/60, supine 98/68. RN notified and is giving pt a bolus. ? ? ?Stairs ?Stairs: Yes ?  ?Stair Management: Forwards, Sideways ?Number of Stairs: 6 ?General stair comments: 3 steps x 2 trials L rail and cane; tried forwards with cane then sideways without AD, she preferred sideways, min A to manage RW, pt's mother present for stair training ? ? ?Wheelchair Mobility ?  ? ?Modified Rankin (Stroke Patients Only) ?  ? ? ?  ?Balance Overall balance assessment: Mild deficits observed, not formally tested ?  ?  ?  ?  ?  ?  ?  ?  ?  ?  ?  ?  ?  ?  ?  ?  ?  ?  ?  ? ?  ?Cognition Arousal/Alertness: Awake/alert ?Behavior During Therapy: [facility] for tasks assessed/performed ?Overall Cognitive Status: Within Functional Limits for tasks assessed ?  ?  ?  ?  ?  ?  ?  ?  ?  ?  ?  ?  ?  ?  ?  ?  ?  ?  ?  ? ?  ?Exercises Total Joint Exercises ?Knee Flexion: AAROM, Right, Seated, 10 reps ? ?  ?General Comments   ?  ?  ? ?  Pertinent Vitals/Pain Pain Assessment ?Pain Score: 5  ?Pain Location: R knee ?Pain Descriptors / Indicators: Aching, Sore ?Pain Intervention(s): Limited activity within patient's tolerance, Monitored during session, Premedicated before session, Ice applied  ? ? ?Home Living   ?  ?  ?  ?  ?  ?  ?  ?  ?  ?   ?  ?Prior Function    ?  ?  ?   ? ?PT Goals (current goals can now be found in the care plan section) Acute Rehab PT Goals ?Patient Stated Goal: finish doctorate in  education ?PT Goal Formulation: With patient/family ?Time For Goal Achievement: 05/21/21 ?Potential to Achieve Goals: Good ?Progress towards PT goals: Progressing toward goals ? ?  ?Frequency ? ? ? 7X/week ? ? ? ?  ?PT Plan Current plan remains appropriate  ? ? ?Co-evaluation   ?  ?  ?  ?  ? ?  ?AM-PAC PT "6 Clicks" Mobility   ?Outcome Measure ? Help needed turning from your back to your side while in a flat bed without using bedrails?: A Little ?Help needed moving from lying on your back to sitting on the side of a flat bed without using bedrails?: A Little ?Help needed moving to and from a bed to a chair (including a wheelchair)?: None ?Help needed standing up from a chair using your arms (e.g., wheelchair or bedside chair)?: None ?Help needed to walk in hospital room?: None ?Help needed climbing 3-5 steps with a railing? : A Little ?6 Click Score: 21 ? ?  ?End of Session Equipment Utilized During Treatment: Gait belt ?Activity Tolerance: Treatment limited secondary to medical complications (Comment) (dizziness) ?Patient left: in bed;with call bell/phone within reach;with family/visitor present ?Nurse Communication: Mobility status ?PT Visit Diagnosis: Difficulty in walking, not elsewhere classified (R26.2);Pain ?Pain - Right/Left: Right ?Pain - part of body: Knee ?  ? ? ?Time: 5102-5852 ?PT Time Calculation (min) (ACUTE ONLY): 34 min ? ?Charges:  $Gait Training: 8-22 mins ?$Therapeutic Exercise: 8-22 mins          ?          ?Ralene Bathe Kistler PT 05/16/2021  ?Acute Rehabilitation Services ?Pager [phone] ?Office [phone] ? ?

## 2021-05-16 NOTE — Progress Notes (Signed)
Physical Therapy Treatment ?Patient Details ?Name: Shirley Morris ?MRN: [id number] ?DOB: 08/10/1970 ?Today's Date: 05/16/2021 ? ? ?History of Present Illness Pt is a 51yo female s/p R-TKA on 05/14/21. PMH: anemia, HTN, SVT, L heart cath, ? ?  ?PT Comments  ? ? Pt reports no dizziness with position changes. She was not orthostatic (see flowsheets for BPs). Practiced getting into a high bed using a foot stool and reviewed TKA HEP. From a PT standpoint, she is ready to DC home.  ?   ?Recommendations for follow up therapy are one component of a multi-disciplinary discharge planning process, led by the attending physician.  Recommendations may be updated based on patient status, additional functional criteria and insurance authorization. ? ?Follow Up Recommendations ? Follow physician's recommendations for discharge plan and follow up therapies ?  ?  ?Assistance Recommended at Discharge Intermittent Supervision/Assistance  ?Patient can return home with the following A little help with walking and/or transfers;A little help with bathing/dressing/bathroom;Assistance with cooking/housework;Assist for transportation;Help with stairs or ramp for entrance ?  ?Equipment Recommendations ? BSC/3in1;Rolling walker (2 wheels)  ?  ?Recommendations for Other Services   ? ? ?  ?Precautions / Restrictions Precautions ?Precautions: Fall;Knee ?Precaution Booklet Issued: Yes (comment) ?Precaution Comments: reviewed no pillow under knee ?Restrictions ?Weight Bearing Restrictions: No  ?  ? ?Mobility ? Bed Mobility ?  ?Bed Mobility: Supine to Sit, Sit to Supine ?  ?  ?Supine to sit: Modified independent (Device/Increase time), HOB elevated ?Sit to supine: Modified independent (Device/Increase time), HOB elevated ?  ?General bed mobility comments: used gait belt as leg lifter, increased time, bed elevated to simulate level of her high bed at home ?  ? ?Transfers ?Overall transfer level: Needs assistance ?Equipment used: Rolling walker (2  wheels) ?Transfers: Sit to/from Stand ?Sit to Stand: Supervision ?  ?  ?  ?  ?  ?General transfer comment: VCs for hand placement, pt denied dizziness in standing ?  ? ?Ambulation/Gait ?Ambulation/Gait assistance: Supervision ?Gait Distance (Feet): 110 Feet ?Assistive device: Rolling walker (2 wheels) ?Gait Pattern/deviations: Step-to pattern ?Gait velocity: decr ?  ?  ?General Gait Details: steady, no loss of balance, distance limited by dizziness. BP sitting 95/60, supine 98/68. RN notified and is giving pt a bolus. ? ? ?Stairs ?Stairs: Yes ?Stairs assistance: Min guard ?Stair Management: Backwards, With walker, No rails ?Number of Stairs: 1 ?General stair comments: simulated getting into her high bed using a foot stool, pt backed up to footstool and stepped up backwards, VCs sequencing, no physical assist needed, pt denied dizziness with step training ? ? ?Wheelchair Mobility ?  ? ?Modified Rankin (Stroke Patients Only) ?  ? ? ?  ?Balance Overall balance assessment: Modified Independent ?  ?  ?  ?  ?  ?  ?  ?  ?  ?  ?  ?  ?  ?  ?  ?  ?  ?  ?  ? ?  ?Cognition Arousal/Alertness: Awake/alert ?Behavior During Therapy: [facility] for tasks assessed/performed ?Overall Cognitive Status: Within Functional Limits for tasks assessed ?  ?  ?  ?  ?  ?  ?  ?  ?  ?  ?  ?  ?  ?  ?  ?  ?  ?  ?  ? ?  ?Exercises Total Joint Exercises ?Ankle Circles/Pumps: AROM, Both, 10 reps, Supine ?Quad Sets: AROM, Right, 5 reps, Supine ?Short Arc Quad: AAROM, Right, Supine, 10 reps ?Heel Slides: AAROM, Right, Supine, 5 reps ?Hip  ABduction/ADduction: AAROM, Right, 10 reps, Supine ?Straight Leg Raises: AAROM, Right, 10 reps, Supine ?Long Arc Quad: Right, AROM, 5 reps, Seated ?Knee Flexion: AAROM, Right, Seated, 10 reps ?Goniometric ROM: 0-45* AAROM R knee ? ?  ?General Comments   ?  ?  ? ?Pertinent Vitals/Pain Pain Assessment ?Pain Score: 7  ?Pain Location: R knee ?Pain Descriptors / Indicators: Aching, Sore ?Pain Intervention(s): Limited activity  within patient's tolerance, Monitored during session, Premedicated before session, Ice applied, Repositioned  ? ? ?Home Living   ?  ?  ?  ?  ?  ?  ?  ?  ?  ?   ?  ?Prior Function    ?  ?  ?   ? ?PT Goals (current goals can now be found in the care plan section) Acute Rehab PT Goals ?Patient Stated Goal: finish doctorate in education ?PT Goal Formulation: With patient/family ?Time For Goal Achievement: 05/21/21 ?Potential to Achieve Goals: Good ?Progress towards PT goals: Progressing toward goals ? ?  ?Frequency ? ? ? 7X/week ? ? ? ?  ?PT Plan Current plan remains appropriate  ? ? ?Co-evaluation   ?  ?  ?  ?  ? ?  ?AM-PAC PT "6 Clicks" Mobility   ?Outcome Measure ? Help needed turning from your back to your side while in a flat bed without using bedrails?: A Little ?Help needed moving from lying on your back to sitting on the side of a flat bed without using bedrails?: A Little ?Help needed moving to and from a bed to a chair (including a wheelchair)?: None ?Help needed standing up from a chair using your arms (e.g., wheelchair or bedside chair)?: None ?Help needed to walk in hospital room?: None ?Help needed climbing 3-5 steps with a railing? : A Little ?6 Click Score: 21 ? ?  ?End of Session Equipment Utilized During Treatment: Gait belt ?Activity Tolerance: Patient tolerated treatment well (dizziness) ?Patient left: in bed;with call bell/phone within reach;with family/visitor present ?Nurse Communication: Mobility status ?PT Visit Diagnosis: Difficulty in walking, not elsewhere classified (R26.2);Pain ?Pain - Right/Left: Right ?Pain - part of body: Knee ?  ? ? ?Time: 4008-6761 ?PT Time Calculation (min) (ACUTE ONLY): 27 min ? ?Charges:  $Gait Training: 8-22 mins ?$Therapeutic Exercise: 8-22 mins          ?          ? ?Tamala Ser PT 05/16/2021  ?Acute Rehabilitation Services ?Pager [phone] ?Office [phone] ? ? ?

## 2021-05-21 NOTE — Discharge Summary (Signed)
Patient ID: ?Shirley DuttonLakesha R Morris ?MRN: [id number] ?DOB/AGE: [birth year] 50 y.o. ? ?Admit date: 05/14/2021 ?Discharge date: 05/16/2021 ? ?Admission Diagnoses:  ?Right knee osteoarthritis ? ?Discharge Diagnoses:  ?Principal Problem: ?  S/P total knee arthroplasty, right ? ? ?Past Medical History:  ?Diagnosis Date  ? Anemia   ? Chest pain   ? Negative stress echo in February of 2011  ? Hypertension   ? Injury of thumb, right   ? Migraine   ? Palpitations   ? SVT (supraventricular tachycardia) (HCC)   ? Tear of meniscus of right knee   ? ? ?Surgeries: Procedure(s): ?TOTAL KNEE ARTHROPLASTY on 05/14/2021 ?  ?Consultants:  ? ?Discharged Condition: Improved ? ?Hospital Course: Shirley Morris is an 51 y.o. female who was admitted 05/14/2021 for operative treatment ofS/P total knee arthroplasty, right. Patient has severe unremitting pain that affects sleep, daily activities, and work/hobbies. After pre-op clearance the patient was taken to the operating room on 05/14/2021 and underwent  Procedure(s): ?TOTAL KNEE ARTHROPLASTY.   ? ?Patient was given perioperative antibiotics:  ?Anti-infectives (From admission, onward)  ? ? Start     Dose/Rate Route Frequency Ordered Stop  ? 05/14/21 2200  vancomycin (VANCOCIN) IVPB 1000 mg/200 mL premix       ? 1,000 mg ?200 mL/hr over 60 Minutes Intravenous Every 12 hours 05/14/21 1322 05/15/21 0139  ? 05/14/21 1115  gentamicin (GARAMYCIN) 400 mg in dextrose 5 % 100 mL IVPB       ? 400 mg ?110 mL/hr over 60 Minutes Intravenous  Once 05/14/21 1102 05/14/21 1214  ? 05/14/21 0745  vancomycin (VANCOREADY) IVPB 1500 mg/300 mL       ? 1,500 mg ?150 mL/hr over 120 Minutes Intravenous On call to O.R. 05/14/21 0739 05/14/21 1140  ? ?  ?  ? ?Patient was given sequential compression devices, early ambulation, and chemoprophylaxis to prevent DVT. Patient worked with PT and was meeting their goals regarding safe ambulation and transfers. ? ?Patient benefited maximally from hospital stay and there  were no complications.   ? ?Recent vital signs: No data found.  ? ?Recent laboratory studies: No results for input(s): WBC, HGB, HCT, PLT, NA, K, CL, CO2, BUN, CREATININE, GLUCOSE, INR, CALCIUM in the last 72 hours. ? ?Invalid input(s): PT, 2 ? ? ?Discharge Medications:   ?Allergies as of 05/16/2021   ? ?   Reactions  ? Penicillins Hives, Swelling  ? tongue swelling  ? Sulfa Antibiotics Swelling  ? Tongue swelling   ? ?  ? ?  ?Medication List  ?  ? ?TAKE these medications   ? ?acetaminophen 325 MG tablet ?Commonly known as: TYLENOL ?Take 3 tablets (975 mg total) by mouth every 6 (six) hours. ?  ?aspirin 81 MG chewable tablet ?Chew 1 tablet (81 mg total) by mouth 2 (two) times daily for 28 days. ?  ?CALCIUM-VITAMIN D PO ?Take 1 tablet by mouth daily. ?  ?cyclobenzaprine 10 MG tablet ?Commonly known as: FLEXERIL ?Take 1 tablet (10 mg total) by mouth 3 (three) times daily as needed for muscle spasms. ?What changed:  ?medication strength ?how much to take ?  ?docusate sodium 100 MG capsule ?Commonly known as: COLACE ?Take 1 capsule (100 mg total) by mouth 2 (two) times daily. ?  ?ferrous sulfate 325 (65 FE) MG tablet ?Take 325 mg by mouth daily with breakfast. ?  ?fluocinonide cream 0.05 % ?Commonly known as: LIDEX ?Apply 1 application topically daily as needed. Eczema ?  ?HYDROmorphone 2 MG tablet ?Commonly  known as: DILAUDID ?Take 1-2 tablets (2-4 mg total) by mouth every 4 (four) hours as needed for severe pain. ?  ?imipramine 25 MG tablet ?Commonly known as: TOFRANIL ?Take 75 mg by mouth at bedtime. ?  ?meloxicam 15 MG tablet ?Commonly known as: MOBIC ?Take 15 mg by mouth daily. ?  ?metoprolol succinate 25 MG 24 hr tablet ?Commonly known as: Toprol XL ?Take 1 tablet (25 mg total) by mouth daily. ?  ?multivitamin tablet ?Take 2 tablets by mouth daily. Flintstone's Chewable ?  ?OVER THE COUNTER MEDICATION ?Take 1 capsule by mouth daily. AZO ?  ?OVER THE COUNTER MEDICATION ?Take 1 capsule by mouth daily. Black Kohosh ?   ?polyethylene glycol 17 g packet ?Commonly known as: MIRALAX / GLYCOLAX ?Take 17 g by mouth daily as needed for mild constipation. ?  ?triamterene-hydrochlorothiazide 37.5-25 MG tablet ?Commonly known as: MAXZIDE-25 ?Take 0.5 tablets by mouth daily. ?  ?Vitamin C 500 MG Caps ?Take 500 mg by mouth daily. ?  ?Vitamin D (Ergocalciferol) 1.25 MG (50000 UNIT) Caps capsule ?Commonly known as: DRISDOL ?Take 50,000 Units by mouth every Wednesday. ?  ?Vitamin D3 25 MCG (1000 UT) Chew ?Chew 1,000 Units by mouth daily. ?  ?Wegovy 1.7 MG/0.75ML Soaj ?Generic drug: Semaglutide-Weight Management ?Inject 1.7 mg into the skin every Tuesday. ?  ? ?  ? ?  ?  ? ? ?  ?Discharge Care Instructions  ?(From admission, onward)  ?  ? ? ?  ? ?  Start     Ordered  ? 05/15/21 0000  Change dressing       ?Comments: Maintain surgical dressing until follow up in the clinic. If the edges start to pull up, may reinforce with tape. If the dressing is no longer working, may remove and cover with gauze and tape, but must keep the area dry and clean.  Call with any questions or concerns.  ? 05/15/21 1010  ? ?  ?  ? ?  ? ? ?Diagnostic Studies: No results found. ? ?Disposition: Discharge disposition: 01-Home or Self Care ? ? ? ? ? ? ?Discharge Instructions   ? ? Call MD / Call 911   Complete by: As directed ?  ? If you experience chest pain or shortness of breath, CALL 911 and be transported to the hospital emergency room.  If you develope a fever above 101 F, pus (white drainage) or increased drainage or redness at the wound, or calf pain, call your surgeon's office.  ? Call MD / Call 911   Complete by: As directed ?  ? If you experience chest pain or shortness of breath, CALL 911 and be transported to the hospital emergency room.  If you develope a fever above 101 F, pus (white drainage) or increased drainage or redness at the wound, or calf pain, call your surgeon's office.  ? Change dressing   Complete by: As directed ?  ? Maintain surgical dressing  until follow up in the clinic. If the edges start to pull up, may reinforce with tape. If the dressing is no longer working, may remove and cover with gauze and tape, but must keep the area dry and clean.  Call with any questions or concerns.  ? Constipation Prevention   Complete by: As directed ?  ? Drink plenty of fluids.  Prune juice may be helpful.  You may use a stool softener, such as Colace (over the counter) 100 mg twice a day.  Use MiraLax (over the counter) for constipation  as needed.  ? Constipation Prevention   Complete by: As directed ?  ? Drink plenty of fluids.  Prune juice may be helpful.  You may use a stool softener, such as Colace (over the counter) 100 mg twice a day.  Use MiraLax (over the counter) for constipation as needed.  ? Diet - low sodium heart healthy   Complete by: As directed ?  ? Diet - low sodium heart healthy   Complete by: As directed ?  ? Increase activity slowly as tolerated   Complete by: As directed ?  ? Weight bearing as tolerated with assist device (walker, cane, etc) as directed, use it as long as suggested by your surgeon or therapist, typically at least 4-6 weeks.  ? Increase activity slowly as tolerated   Complete by: As directed ?  ? Post-operative opioid taper instructions:   Complete by: As directed ?  ? POST-OPERATIVE OPIOID TAPER INSTRUCTIONS: ?It is important to wean off of your opioid medication as soon as possible. If you do not need pain medication after your surgery it is ok to stop day one. ?Opioids include: ?Codeine, Hydrocodone(Norco, Vicodin), Oxycodone(Percocet, oxycontin) and hydromorphone amongst others.  ?Long term and even short term use of opiods can cause: ?Increased pain response ?Dependence ?Constipation ?Depression ?Respiratory depression ?And more.  ?Withdrawal symptoms can include ?Flu like symptoms ?Nausea, vomiting ?And more ?Techniques to manage these symptoms ?Hydrate well ?Eat regular healthy meals ?Stay active ?Use relaxation  techniques(deep breathing, meditating, yoga) ?Do Not substitute Alcohol to help with tapering ?If you have been on opioids for less than two weeks and do not have pain than it is ok to stop all together.  ?Plan to we

## 2021-10-30 IMAGING — CT CT HEART MORP W/ CTA COR W/ SCORE W/ CA W/CM &/OR W/O CM
4 of 7 series · 8 of 20 positions shown, 9 images · IV contrast (APPLIED)
Comparison: None.
COMPARISON: None.

Addendum:
EXAM:
OVER-READ INTERPRETATION  CT CHEST

The following report is an over-read performed by radiologist Dr.
Heinz Hudak [REDACTED] on 03/16/2019. This
over-read does not include interpretation of cardiac or coronary
anatomy or pathology. The coronary calcium score/coronary CTA
interpretation by the cardiologist is attached.
CLINICAL DATA: 48 year old with chest pain
Cardiac/Coronary  CTA
TECHNIQUE: The patient was scanned on a [employer] Force scanner.

[Series 6: best diast 72 % · axial · 0.39mm/px · z∈[+1180,+1213]mm · 2 of 251 slices shown, 3 images]
[im 84/251  vessel]
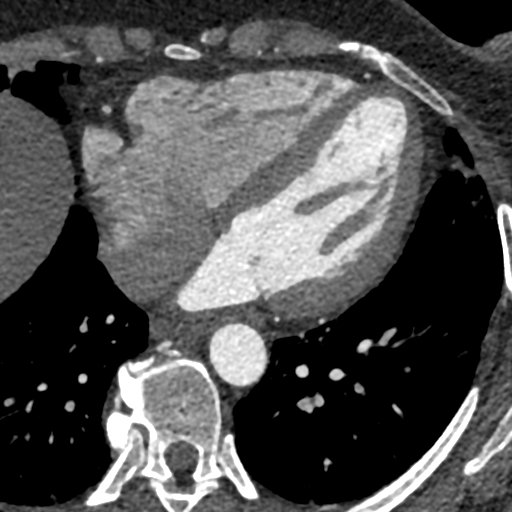
[im 84/251  lung]
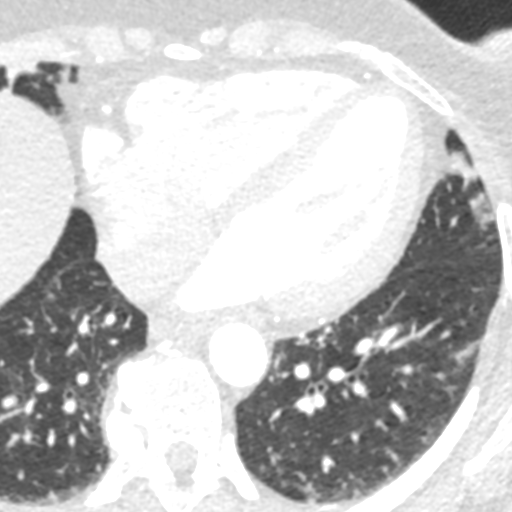
[im 167/251  vessel]
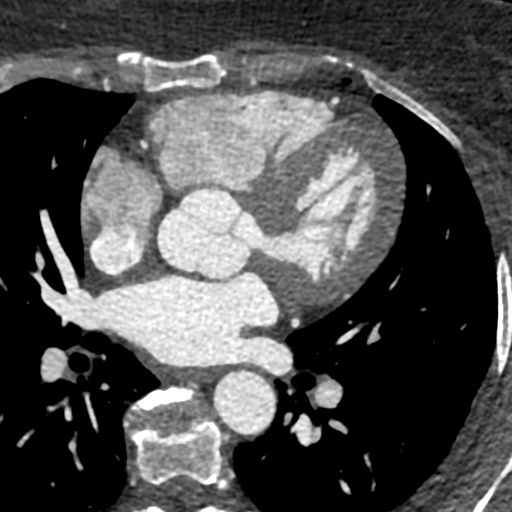

[Series 8: best syst 34 % · axial · 0.39mm/px · z∈[+1180,+1213]mm · 2 of 251 slices shown]
[im 84/251  vessel]
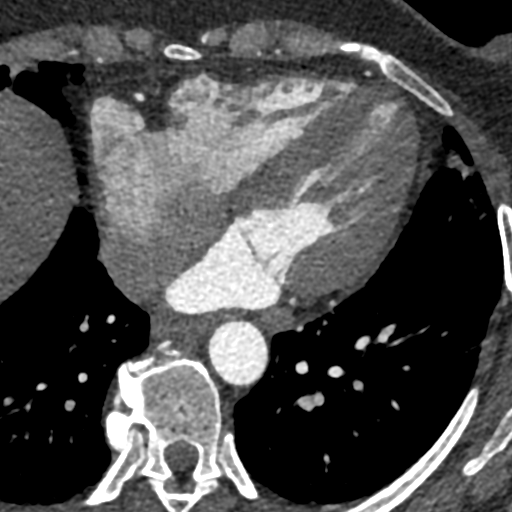
[im 167/251  vessel]
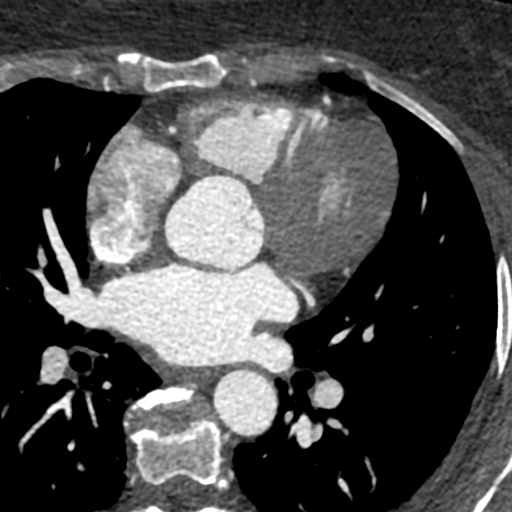

[Series 9: ts diast sharp 34 % · axial · 0.39mm/px · z∈[+1180,+1213]mm · 2 of 251 slices shown]
[im 84/251  lung]
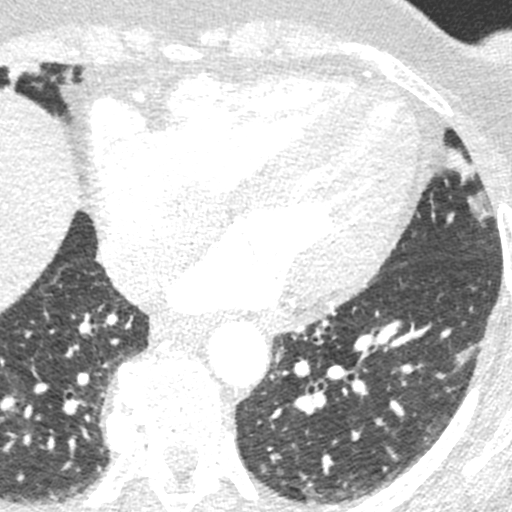
[im 167/251  lung]
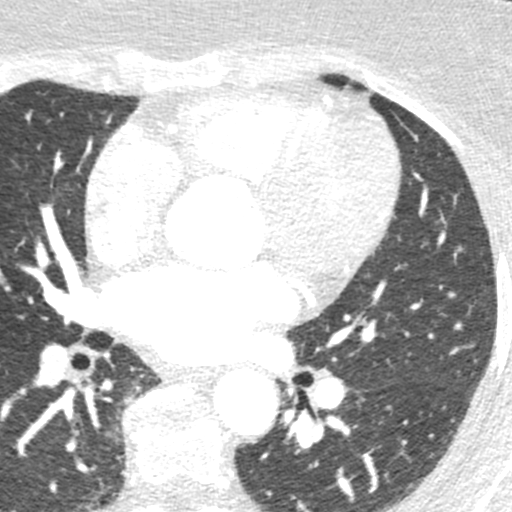

[Series 10: ts syst sharp 34 % · axial · 0.39mm/px · z∈[+1180,+1213]mm · 2 of 251 slices shown]
[im 84/251  lung]
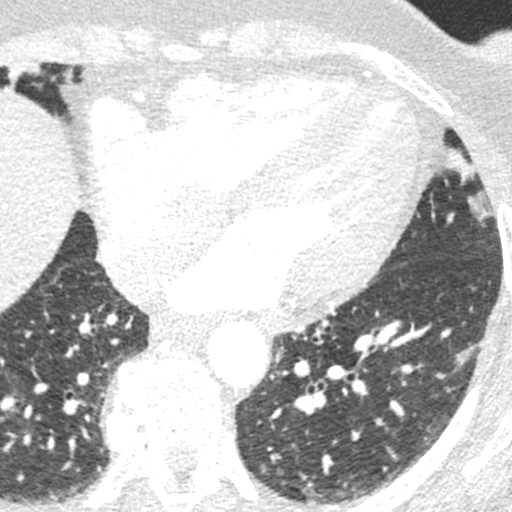
[im 167/251  lung]
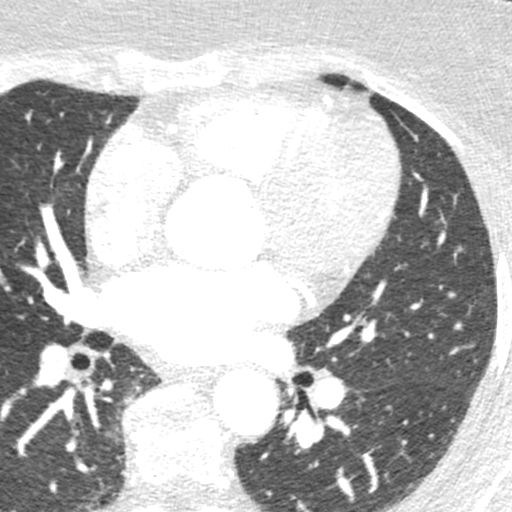

[8 of 20 positions shown; findings below may reference images not displayed]

FINDINGS: Within the visualized portions of the thorax there are no suspicious
appearing pulmonary nodules or masses, there is no acute
consolidative airspace disease, no pleural effusions, no
pneumothorax and no lymphadenopathy. Visualized portions of the
upper abdomen are unremarkable. There are no aggressive appearing
lytic or blastic lesions noted in the visualized portions of the
skeleton.
IMPRESSION: No significant incidental noncardiac findings are noted.
FINDINGS: A 100 kV prospective scan was triggered in the descending thoracic
aorta at 111 HU's. Axial non-contrast 3 mm slices were carried out
through the heart. The data set was analyzed on a dedicated work
station and scored using the Agatson method. Gantry rotation speed
was 250 msecs and collimation was .6 mm. 50 mg metoprolol beta
blockade and 0.8 mg of sl NTG was given. The 3D data set was
reconstructed in 5% intervals of the 67-82 % of the R-R cycle.
Diastolic phases were analyzed on a dedicated work station using
MPR, MIP and VRT modes. The patient received 80 cc of contrast.

Aorta:  Normal size.  No calcifications.  No dissection.

Aortic Valve:  Trileaflet.  No calcifications.

Coronary Arteries:  Normal coronary origin.  Right dominance.

RCA is a large dominant artery that gives rise to PDA and PLA. There
is no plaque.

Left main is a large artery that gives rise to LAD and LCX arteries.

LAD is a large vessel that has no plaque. There are 3 small caliber
diagonal branches

LCX is a non-dominant artery that gives rise to 3 branching OMs.
There is no plaque.

Other findings:

Normal pulmonary vein drainage into the left atrium.

Normal left atrial appendage without a thrombus.

Normal size of the pulmonary artery.
IMPRESSION: 1. Coronary calcium score of 0. This was 0 percentile for age and
sex matched control.

2. Normal coronary origin with right dominance.

3. No evidence of CAD.

CAD-RADS 0. No evidence of CAD (0%). Consider non-atherosclerotic
causes of chest pain.

*** End of Addendum ***
EXAM:
OVER-READ INTERPRETATION  CT CHEST

The following report is an over-read performed by radiologist Dr.
Heinz Hudak [REDACTED] on 03/16/2019. This
over-read does not include interpretation of cardiac or coronary
anatomy or pathology. The coronary calcium score/coronary CTA
interpretation by the cardiologist is attached.
FINDINGS: Within the visualized portions of the thorax there are no suspicious
appearing pulmonary nodules or masses, there is no acute
consolidative airspace disease, no pleural effusions, no
pneumothorax and no lymphadenopathy. Visualized portions of the
upper abdomen are unremarkable. There are no aggressive appearing
lytic or blastic lesions noted in the visualized portions of the
skeleton.
IMPRESSION: No significant incidental noncardiac findings are noted.

## 2021-11-17 ENCOUNTER — Encounter: Payer: Self-pay | Admitting: Internal Medicine

## 2022-02-09 ENCOUNTER — Other Ambulatory Visit: Payer: Self-pay | Admitting: Nurse Practitioner

## 2022-03-06 ENCOUNTER — Other Ambulatory Visit: Payer: Self-pay | Admitting: Nurse Practitioner

## 2022-04-11 ENCOUNTER — Encounter: Payer: Self-pay | Admitting: Cardiovascular Disease

## 2022-04-11 NOTE — Progress Notes (Signed)
Cardiology Office Note   Date:  04/12/2022   ID:  Shirley Morris, DOB 1970-09-23, MRN [id number]  PCP:  Liliane Shi, MD  Cardiologist:   Mertie Moores, MD   Chief Complaint  Patient presents with   Hypertension         1. Hypertension 2. History of obesity-status post gastric bypass 3. Chest pain 4. Supraventricular tachycardia    Shirley Morris is a 53 y.o. female who presents for follow up of her HTN  she had  GYN surgery . Her pre-op myoview showed some abnormalities but the subsequent cardiac cath  was completely normal.   She had a hysterectomy and unfortunate had some healing complications  related to that. She still getting wound care.  BP and heart rate have remained normal.  March 23, 2018:  Patient is seen today for follow-up of her retention and history of chest discomfort. Lives in [place] now Has had some leg swelling  No CP , breathing is ok Has DOE climbing stairs.    Her last stress test was in November, 2015 which revealed a very mild anterior defect likely due to breast attenuation.   She was needing to have a partial hysterectomy so we proceed with heart catheterization.  The heart cath  In 2015 revealed normal coronary arteries.  She has normal left ventricular systolic function.   Has leg edema - especially at the end of the day  We discussed the Lounge Dr. Bertram Gala rest   Jan. 4, 2021  Shirley Morris is seen today for follow-up of her episodes of chest discomfort. Body mass index is 46.12 kg/m. Wt is 277 lbs.  Has lost 23 lbs since Oct, 2019  She is status post gastric bypass. She is she is had chest pain for years.  She had a normal heart catheterization in 2015.  She has had some atypical CP ,  Sharp pain. Lasted 3 minutes.   Left side of chest .  Work up with pain under her left breast Lasted for 15  Minutes.   Took EX tylenol  Is not getting much exercise.  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 18, 2022 Wt today is 236 ( from 300 lbs )  Doing well .   No dyspnea.   Getting some exercise    April 12, 2022  Shirley Morris is seen for follow up of her chest pain, morbid obesity Wt is 220  No CP ,  no dyspnea Had R TKA last year Is back in physcial therapy  twice a week   Has not having any further episodes of tachycardia   She had a normal coronary CT angiogram in February, 2021.  Coronary calcium score is 0.  No evidence of coronary artery disease.   Past Medical History:  Diagnosis Date   Anemia    Chest pain    Negative stress echo in February of 2011   Hypertension    Injury of thumb, right    Migraine    Palpitations    SVT (supraventricular tachycardia)    Tear of meniscus of right knee     Past Surgical History:  Procedure Laterality Date   BREAST BIOPSY Right 07/08/2020   Procedure: EXCISION OF RIGHT BREAST MASS;  Surgeon: Rolm Bookbinder, MD;  Location: [facility];  Service: General;  Laterality: Right;   [place] bypass surgery by laparocopy  01/25/2005  KNEE ARTHROSCOPY Right 01/25/2009   x4   LAPAROSCOPIC ASSISTED VAGINAL HYSTERECTOMY Right 12/17/2013   Procedure: LAPAROSCOPIC ASSISTED VAGINAL HYSTERECTOMY WITH RIGHT SALPINGO OOPHERECTOMY ;  Surgeon: Eldred Manges, MD;  Location: [facility];  Service: Gynecology;  Laterality: Right;   LEFT HEART CATHETERIZATION WITH CORONARY ANGIOGRAM N/A 12/11/2013   Procedure: LEFT HEART CATHETERIZATION WITH CORONARY ANGIOGRAM;  Surgeon: Peter M Martinique, MD;  Location: [facility];  Service: Cardiovascular;  Laterality: N/A;   TOTAL KNEE ARTHROPLASTY Right 05/14/2021   Procedure: TOTAL KNEE ARTHROPLASTY;  Surgeon: Paralee Cancel, MD;  Location: [facility];  Service: Orthopedics;  Laterality: Right;   US ECHOCARDIOGRAPHY  11/26/2008   EF 55-60%     Current Outpatient Medications  Medication Sig Dispense Refill   acetaminophen (TYLENOL) 325 MG tablet Take 3 tablets (975  mg total) by mouth every 6 (six) hours.     Ascorbic Acid (VITAMIN C) 500 MG CAPS Take 500 mg by mouth daily.     CALCIUM-VITAMIN D PO Take 1 tablet by mouth daily.     Cholecalciferol (VITAMIN D3) 25 MCG (1000 UT) CHEW Chew 1,000 Units by mouth daily.     cyclobenzaprine (FLEXERIL) 10 MG tablet Take 1 tablet (10 mg total) by mouth 3 (three) times daily as needed for muscle spasms. 30 tablet 0   fluocinonide cream (LIDEX) [id number] % Apply 1 application topically daily as needed. Eczema  0   imipramine (TOFRANIL) 25 MG tablet Take 75 mg by mouth at bedtime.     metoprolol succinate (TOPROL-XL) 25 MG 24 hr tablet TAKE 1 TABLET (25 MG TOTAL) BY MOUTH DAILY. 90 tablet 0   Multiple Vitamin (MULTIVITAMIN) tablet Take 2 tablets by mouth daily. Flintstone's Chewable     OVER THE COUNTER MEDICATION Take 1 capsule by mouth daily. AZO     OVER THE COUNTER MEDICATION Take 1 capsule by mouth daily. Black Kohosh     triamterene-hydrochlorothiazide (MAXZIDE-25) 37.5-25 MG tablet TAKE 1/2 TABLET BY MOUTH EVERY DAY 45 tablet 0   Vitamin D, Ergocalciferol, (DRISDOL) 1.25 MG (50000 UT) CAPS capsule Take 50,000 Units by mouth every Wednesday.     WEGOVY 1.7 MG/0.75ML SOAJ Inject 1.7 mg into the skin every Tuesday.     No current facility-administered medications for this visit.    Allergies:   Penicillins and Sulfa antibiotics    Social History:  The patient  reports that she has never smoked. She has never used smokeless tobacco. She reports that she does not drink alcohol and does not use drugs.   Family History:  The patient's family history includes Colon cancer in her cousin and maternal uncle; Coronary artery disease in her father; Diabetes in her father; Hypertension in her father.    ROS:  Please see the history of present illness.     Physical Exam: Blood pressure 120/80, pulse 60, height 5\' 5"  (1.651 m), weight 220 lb 12.8 oz (100.2 kg), SpO2 90 %.       GEN:  Well nourished, well developed in  no acute distress HEENT: Normal NECK: No JVD; No carotid bruits LYMPHATICS: No lymphadenopathy CARDIAC: RRR , very systolic murmur ( likely outflow murmur )  RESPIRATORY:  Clear to auscultation without rales, wheezing or rhonchi  ABDOMEN: Soft, non-tender, non-distended MUSCULOSKELETAL:  No edema; No deformity  SKIN: Warm and dry NEUROLOGIC:  Alert and oriented x 3     EKG:       April 12, 2022.  NSR at 60 .  Normal  Recent Labs: 05/07/2021: ALT 19 05/15/2021: BUN 9; Creatinine, Ser 0.62; Hemoglobin 11.2; Platelets 270; Potassium 3.4; Sodium 140    Lipid Panel No results found for: "CHOL", "TRIG", "HDL", "CHOLHDL", "VLDL", "LDLCALC", "LDLDIRECT"    Wt Readings from Last 3 Encounters:  04/12/22 220 lb 12.8 oz (100.2 kg)  05/14/21 234 lb (106.1 kg)  05/07/21 234 lb (106.1 kg)      Other studies Reviewed: Additional studies/ records that were reviewed today include:  Cardiac cath notes, angiograms Review of the above records demonstrates:  Normal coronaries    ASSESSMENT AND PLAN:  1. Hypertension -     BP is well controlled .  Continue current medications  2. History of obesity  -   has been losing weight   She has moved to [place], [place]  I have given her the name of a doctor I worked with in [place] ( Sunday Shams, MD)  She will give their office a call       The following changes have been made:  no change   Disposition:       Signed, Mertie Moores, MD  04/12/2022 9:41 AM    [address], [place], [place]  [address] Phone: [phone]; Fax: [phone]

## 2022-04-12 ENCOUNTER — Encounter: Payer: Self-pay | Admitting: Cardiovascular Disease

## 2022-04-12 ENCOUNTER — Ambulatory Visit: Payer: BC Managed Care – PPO | Attending: Cardiovascular Disease | Admitting: Cardiovascular Disease

## 2022-04-12 VITALS — BP 120/80 | HR 60 | Ht 65.0 in | Wt 220.8 lb

## 2022-04-12 DIAGNOSIS — I10 Essential (primary) hypertension: Secondary | ICD-10-CM | POA: Diagnosis not present

## 2022-04-12 MED ORDER — METOPROLOL SUCCINATE ER 25 MG PO TB24: 25.0000 mg | ORAL_TABLET | Freq: Every day | ORAL | 3 refills | Status: DC

## 2022-04-12 NOTE — Patient Instructions (Signed)
Medication Instructions:   Your physician recommends that you continue on your current medications as directed. Please refer to the Current Medication list given to you today.  *If you need a refill on your cardiac medications before your next appointment, please call your pharmacy*    Follow-Up: At [facility], you and your health needs are our priority.  As part of our continuing mission to provide you with exceptional heart care, we have created designated Provider Care Teams.  These Care Teams include your primary Cardiologist (physician) and Advanced Practice Providers (APPs -  Physician Assistants and Nurse Practitioners) who all work together to provide you with the care you need, when you need it.  We recommend signing up for the patient portal called "MyChart".  Sign up information is provided on this After Visit Summary.  MyChart is used to connect with patients for Virtual Visits (Telemedicine).  Patients are able to view lab/test results, encounter notes, upcoming appointments, etc.  Non-urgent messages can be sent to your provider as well.   To learn more about what you can do with MyChart, go to [email or web address].    Your next appointment:   1 year(s)  Provider:   Philip Nahser, MD       

## 2022-07-01 ENCOUNTER — Other Ambulatory Visit: Payer: Self-pay | Admitting: Cardiovascular Disease

## 2022-10-01 ENCOUNTER — Other Ambulatory Visit: Payer: Self-pay | Admitting: Cardiovascular Disease

## 2023-02-01 ENCOUNTER — Telehealth: Payer: Self-pay | Admitting: Cardiovascular Disease

## 2023-02-01 DIAGNOSIS — I471 Supraventricular tachycardia, unspecified: Secondary | ICD-10-CM

## 2023-02-01 DIAGNOSIS — I10 Essential (primary) hypertension: Secondary | ICD-10-CM

## 2023-02-01 NOTE — Telephone Encounter (Signed)
 Patient called and mentioned that she would like for Dr. Elease Hashimoto to send in a referral to an office in [place], [place]

## 2023-02-02 NOTE — Telephone Encounter (Signed)
 Morris, Shirley PARAS, MD sent to Shirley Lauraine POUR, RN Caller: Unspecified (Yesterday,  3:08 PM) Pt has moved to [place] I used to work with Rock Plowman, MD [address] [place] , [place]  [address]. [phone] We trained together at [facility] . 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   Referral placed to Rock Plowman, MD. Called patient and made aware. She requests to keep currently scheduled appt with us  until she's contacted by them for scheduling.

## 2023-03-21 ENCOUNTER — Other Ambulatory Visit: Payer: Self-pay | Admitting: Cardiovascular Disease

## 2023-04-08 ENCOUNTER — Ambulatory Visit: Payer: Self-pay | Admitting: Cardiovascular Disease

## 2023-04-17 ENCOUNTER — Other Ambulatory Visit: Payer: Self-pay | Admitting: Cardiovascular Disease

## 2023-04-17 DIAGNOSIS — I10 Essential (primary) hypertension: Secondary | ICD-10-CM

## 2023-04-18 ENCOUNTER — Telehealth: Payer: Self-pay | Admitting: Cardiovascular Disease

## 2023-04-18 ENCOUNTER — Encounter: Payer: Self-pay | Admitting: Cardiovascular Disease

## 2023-04-18 DIAGNOSIS — I10 Essential (primary) hypertension: Secondary | ICD-10-CM

## 2023-04-18 MED ORDER — METOPROLOL SUCCINATE ER 25 MG PO TB24: 25.0000 mg | ORAL_TABLET | Freq: Every day | ORAL | 0 refills | Status: DC

## 2023-04-18 NOTE — Telephone Encounter (Signed)
 Pt has moved out of town and doesn't have an appt until April 29th with her new Cardiologist and would need this medication before than. Pt has been out of this medication for two days       Please address

## 2023-04-18 NOTE — Telephone Encounter (Signed)
 Per OAV

## 2023-04-18 NOTE — Telephone Encounter (Signed)
 Per OV note 04/12/22 by Nahser:  1. Hypertension -     BP is well controlled .  Continue current medications   2. 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

## 2023-04-18 NOTE — Telephone Encounter (Signed)
*  STAT* If patient is at the pharmacy, call can be transferred to refill team.   1. Which medications need to be refilled? (please list name of each medication and dose if known) metoprolol succinate (TOPROL-XL) 25 MG 24 hr tablet   2. Would you like to learn more about the convenience, safety, & potential cost savings by using the [facility]? no   3. Are you open to using the [facility] (Type [facility].  ).no   4. Which pharmacy/location (including street and city if local pharmacy) is medication to be sent to? [id number] - [place], [place] - [address]    5. Do they need a 30 day or 90 day supply? 30 day   Pt has moved out of town and doesn't have an appt until April 29th with her new Cardiologist and would need this medication before than. Pt has been out of this medication for two days

## 2023-04-18 NOTE — Telephone Encounter (Signed)
error 

## 2023-05-14 ENCOUNTER — Other Ambulatory Visit: Payer: Self-pay | Admitting: Cardiovascular Disease

## 2023-05-14 DIAGNOSIS — I10 Essential (primary) hypertension: Secondary | ICD-10-CM
